# Patient Record
Sex: Male | Born: 1985
Health system: Southern US, Community
[De-identification: ages and names within clinical notes are randomized; demographics above are authoritative.]

## PROBLEM LIST (undated history)

## (undated) HISTORY — PX: APPENDECTOMY: SHX54

## (undated) HISTORY — PX: KNEE ARTHROSCOPY WITH MENISCAL REPAIR: SHX5653

---
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
## 2003-12-19 ENCOUNTER — Emergency Department (HOSPITAL_COMMUNITY): Admission: EM | Admit: 2003-12-19 | Discharge: 2003-12-20 | Payer: Self-pay | Admitting: Emergency Medicine

## 2009-07-29 ENCOUNTER — Ambulatory Visit (HOSPITAL_COMMUNITY): Admission: RE | Admit: 2009-07-29 | Discharge: 2009-07-29 | Payer: Self-pay | Admitting: Neurology

## 2010-12-18 ENCOUNTER — Encounter: Payer: Self-pay | Admitting: Physician Assistant

## 2011-01-01 NOTE — Op Note (Signed)
[facility]  Patient:    Robert Valentine, Robert Valentine                      MRN: [id number] Proc. Date: 12/21/00 Adm. Date:  [id number] Disc. Date: [id number] Attending:  Meredith Leeds                           Operative Report  PREOPERATIVE DIAGNOSIS:  Acute appendicitis.  POSTOPERATIVE DIAGNOSIS:  Acute appendicitis.  OPERATION PERFORMED:  Laparoscopic appendectomy.  SURGEON:  Dr. Orson Slick.  ANESTHESIA:  General.  DESCRIPTION OF PROCEDURE:  After the patient was anesthetized and had routine preparation and draping of the abdomen and a Foley catheter put in, I made a short incision transversely just below the umbilicus, cut the fascia longitudinally and entered the peritoneum bluntly. I put in an #0 Vicryl pursestring suture and secured a Hasson cannula. After inflating the abdomen with CO2, I inspected the viscera and saw no abnormalities except for an inflamed appendix. I put in two additional ports and then exposed the area and retracted the appendix upward. The patient was thin the anatomy was quite clear. I dissected adhesions away from the cecum and pelvic wall and then stapled the appendix and its mesentery with a single firing of the endoscopic vascular height stapler. Hemostasis was good. There was no free fluid noted. I removed the appendix from the body in a plastic pouch through the lower abdominal incision and then checked and saw that there was no bleeding from that incision. I removed the right lateral port under direct vision and then released the pneumoperitoneum and tied the pursestring suture in the umbilical incision. I closed the skin over all the incisions with intracuticular 4-0 Vicryl and Steri-Strips and applied bandages. He tolerated the operation well. D:  12/21/00 TD:  12/22/00 Job: [id number]

## 2013-01-11 ENCOUNTER — Encounter: Payer: BC Managed Care – PPO | Admitting: Nurse Practitioner

## 2013-01-11 NOTE — Progress Notes (Signed)
This encounter was created in error - please disregard.

## 2013-01-18 ENCOUNTER — Ambulatory Visit: Payer: BC Managed Care – PPO | Admitting: Nurse Practitioner

## 2015-04-09 ENCOUNTER — Telehealth: Payer: Self-pay | Admitting: Family Medicine

## 2015-04-16 NOTE — Telephone Encounter (Signed)
Phone call for acute care appt was note returned.  Encounter closed.

## 2015-11-19 ENCOUNTER — Ambulatory Visit (INDEPENDENT_AMBULATORY_CARE_PROVIDER_SITE_OTHER): Payer: Self-pay | Admitting: Family Medicine

## 2015-11-19 ENCOUNTER — Encounter (INDEPENDENT_AMBULATORY_CARE_PROVIDER_SITE_OTHER): Payer: Self-pay

## 2015-11-19 ENCOUNTER — Encounter: Payer: Self-pay | Admitting: Family Medicine

## 2015-11-19 VITALS — BP 114/66 | HR 68 | Temp 98.0°F | Ht 67.0 in | Wt 190.0 lb

## 2015-11-19 DIAGNOSIS — Z024 Encounter for examination for driving license: Secondary | ICD-10-CM

## 2015-11-19 DIAGNOSIS — Z139 Encounter for screening, unspecified: Secondary | ICD-10-CM

## 2015-11-19 DIAGNOSIS — Z029 Encounter for administrative examinations, unspecified: Secondary | ICD-10-CM

## 2015-11-19 LAB — URINALYSIS
Bilirubin, UA: NEGATIVE
GLUCOSE, UA: NEGATIVE
KETONES UA: NEGATIVE
LEUKOCYTES UA: NEGATIVE
Nitrite, UA: NEGATIVE
RBC, UA: NEGATIVE
Specific Gravity, UA: 1.02 (ref 1.005–1.030)
UUROB: 0.2 mg/dL (ref 0.2–1.0)
pH, UA: 6 (ref 5.0–7.5)

## 2015-11-19 NOTE — Progress Notes (Signed)
Subjective:  Patient ID: Robert Valentine, male    DOB: 1986/05/31  Age: 30 y.o. MRN: [id number]  CC: DOT PE   HPI Robert Valentine presents for DOT PE   History Robert Valentine has no past medical history on file.   He has no past surgical history on file.   His family history is not on file.He reports that he has never smoked. He does not have any smokeless tobacco history on file. He reports that he drinks about 6.0 oz of alcohol per week. He reports that he does not use illicit drugs.    ROS Review of Systems  Constitutional: Negative for fever, chills, diaphoresis, activity change, appetite change, fatigue and unexpected weight change.  HENT: Negative for congestion, ear pain, hearing loss, postnasal drip, rhinorrhea, sore throat, tinnitus and trouble swallowing.   Eyes: Negative for photophobia, pain, discharge and redness.  Respiratory: Negative for apnea, cough, choking, chest tightness, shortness of breath, wheezing and stridor.   Cardiovascular: Negative for chest pain, palpitations and leg swelling.  Gastrointestinal: Negative for nausea, vomiting, abdominal pain, diarrhea, constipation, blood in stool and abdominal distention.  Endocrine: Negative for cold intolerance, heat intolerance, polydipsia, polyphagia and polyuria.  Genitourinary: Negative for dysuria, urgency, frequency, hematuria, flank pain, enuresis, difficulty urinating and genital sores.  Musculoskeletal: Negative for joint swelling and arthralgias.  Skin: Negative for color change, rash and wound.  Allergic/Immunologic: Negative for immunocompromised state.  Neurological: Negative for dizziness, tremors, seizures, syncope, facial asymmetry, speech difficulty, weakness, light-headedness, numbness and headaches.  Hematological: Does not bruise/bleed easily.  Psychiatric/Behavioral: Negative for suicidal ideas, hallucinations, behavioral problems, confusion, sleep disturbance, dysphoric mood, decreased concentration  and agitation. The patient is not nervous/anxious and is not hyperactive.     Objective:  BP 114/66 mmHg  Pulse 68  Temp(Src) 98 F (36.7 C) (Oral)  Ht  (1.702 m)  Wt 190 lb (86.183 kg)  BMI 29.75 kg/m2  SpO2 100%  BP Readings from Last 3 Encounters:  11/19/15 114/66    Wt Readings from Last 3 Encounters:  11/19/15 190 lb (86.183 kg)     Physical Exam  Constitutional: He is oriented to person, place, and time. He appears well-developed and well-nourished.  HENT:  Head: Normocephalic and atraumatic.  Mouth/Throat: Oropharynx is clear and moist.  Eyes: EOM are normal. Pupils are equal, round, and reactive to light.  Neck: Normal range of motion. No tracheal deviation present. No thyromegaly present.  Cardiovascular: Normal rate, regular rhythm and normal heart sounds.  Exam reveals no gallop and no friction rub.   No murmur heard. Pulmonary/Chest: Breath sounds normal. He has no wheezes. He has no rales.  Abdominal: Soft. He exhibits no mass. There is no tenderness.  Musculoskeletal: Normal range of motion. He exhibits no edema.  Neurological: He is alert and oriented to person, place, and time.  Skin: Skin is warm and dry.  Psychiatric: He has a normal mood and affect.     No results found for: WBC, HGB, HCT, PLT, GLUCOSE, CHOL, TRIG, HDL, LDLDIRECT, LDLCALC, ALT, AST, NA, K, CL, CREATININE, BUN, CO2, TSH, PSA, INR, GLUF, HGBA1C, MICROALBUR  Mr Brain Wo Contrast  07/29/2009  Clinical Data: Headache.  Numbness.  MRI HEAD WITHOUT CONTRAST  Technique:  Multiplanar, multiecho pulse sequences of the brain and surrounding structures were obtained according to standard protocol without intravenous contrast.  Comparison: None  Findings: The brain has a normal appearance on all pulse sequences without evidence of atrophy, old or acute infarction,  mass lesion, hemorrhage, hydrocephalus or extra-axial collection.  The pituitary gland appears normal.  There are mild mucosal  inflammatory changes of the right maxillary sinus and of a central division of the frontal sinus.  No skull or skull base lesion is seen.  IMPRESSION: Normal appearance of the brain.  Mild sinus mucosal inflammation affecting the right maxillary sinus and a central division of the frontal sinus. Provider: Rodney BoozeMichael Gilliam, Gareth MorganSusan Singleton   Assessment & Plan:   [name] was seen today for dot pe.  Diagnoses and all orders for this visit:  Screening -     Urinalysis  Encounter for Department of Transportation (DOT) examination for driving license renewal    Counseled pt. Regarding recreational drug use and various health as well as professional risks       Follow-up: No Follow-up on file.  Mechele ClaudeWarren Iriel Nason, M.D.

## 2020-04-24 DIAGNOSIS — M791 Myalgia, unspecified site: Secondary | ICD-10-CM | POA: Diagnosis not present

## 2020-04-24 DIAGNOSIS — R519 Headache, unspecified: Secondary | ICD-10-CM | POA: Diagnosis not present

## 2020-04-24 DIAGNOSIS — Z1152 Encounter for screening for COVID-19: Secondary | ICD-10-CM | POA: Diagnosis not present

## 2021-04-28 ENCOUNTER — Encounter: Payer: Self-pay | Admitting: Family Medicine

## 2021-04-28 ENCOUNTER — Other Ambulatory Visit: Payer: Self-pay

## 2021-04-28 ENCOUNTER — Ambulatory Visit (INDEPENDENT_AMBULATORY_CARE_PROVIDER_SITE_OTHER): Payer: BC Managed Care – PPO

## 2021-04-28 ENCOUNTER — Ambulatory Visit (INDEPENDENT_AMBULATORY_CARE_PROVIDER_SITE_OTHER): Payer: BC Managed Care – PPO | Admitting: Family Medicine

## 2021-04-28 VITALS — BP 132/82 | HR 71 | Temp 97.5°F | Ht 67.0 in | Wt 189.0 lb

## 2021-04-28 DIAGNOSIS — Z125 Encounter for screening for malignant neoplasm of prostate: Secondary | ICD-10-CM

## 2021-04-28 DIAGNOSIS — R030 Elevated blood-pressure reading, without diagnosis of hypertension: Secondary | ICD-10-CM

## 2021-04-28 DIAGNOSIS — R202 Paresthesia of skin: Secondary | ICD-10-CM | POA: Diagnosis not present

## 2021-04-28 DIAGNOSIS — M5410 Radiculopathy, site unspecified: Secondary | ICD-10-CM

## 2021-04-28 DIAGNOSIS — G8929 Other chronic pain: Secondary | ICD-10-CM

## 2021-04-28 DIAGNOSIS — Z6829 Body mass index (BMI) 29.0-29.9, adult: Secondary | ICD-10-CM | POA: Diagnosis not present

## 2021-04-28 DIAGNOSIS — M25511 Pain in right shoulder: Secondary | ICD-10-CM

## 2021-04-28 DIAGNOSIS — Z8042 Family history of malignant neoplasm of prostate: Secondary | ICD-10-CM | POA: Diagnosis not present

## 2021-04-28 MED ORDER — METHYLPREDNISOLONE ACETATE 80 MG/ML IJ SUSP
80.0000 mg | Freq: Once | INTRAMUSCULAR | Status: AC
  Administered 2021-04-28: 80 mg via INTRAMUSCULAR

## 2021-04-28 NOTE — Progress Notes (Signed)
Subjective:  Patient ID: Robert Valentine, male    DOB: July 13, 1986, 35 y.o.   MRN: [id number]  Patient Care Team: Baruch Gouty, FNP as PCP - General (Family Medicine)   Chief Complaint:  New Patient (Initial Visit) (Establish new patient//Right shoulder pain, numbness down to the hand//Patient hoping for MRI)   HPI: Robert Valentine is a 35 y.o. male presenting on 04/28/2021 for New Patient (Initial Visit) (Establish new patient//Right shoulder pain, numbness down to the hand//Patient hoping for MRI)   Pt presents today to establish care with PCP and for evaluation of right shoulder pain with numbness at times in his right thumb and index finger. He has not been seen by a PCP in several years and has not had any recent lab work. 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 States his shoulder has been hurting for several years but has become more frequent and bothersome over the last few months. He will take an aleve if the pain is severe with some relief of symptoms. No loss of function but does have significant pain with certain movements. Denies neck pain. No known injuries.  He states his father had prostate cancer and was told it was genetic, he was supposed to start PSA screenings at 5 but did not. He denies any urinary symptoms or rectal pressure or pain.    Relevant past medical, surgical, family, and social history reviewed and updated as indicated.  Allergies and medications reviewed and updated. Data reviewed: Chart in Epic.   History reviewed. No pertinent past medical history.  History reviewed. No pertinent surgical history.  Social History   Socioeconomic History   Marital status: Married    Spouse name: Not on file   Number of children: Not on file   Years of education: Not on file   Highest education level: Not on file  Occupational History   Not on file  Tobacco Use   Smoking status: Never    Smokeless tobacco: Current    Types: Snuff  Substance and Sexual Activity   Alcohol use: Yes    Alcohol/week: 10.0 standard drinks    Types: 10 Standard drinks or equivalent per week   Drug use: No   Sexual activity: Not on file  Other Topics Concern   Not on file  Social History Narrative   Not on file   Social Determinants of Health   Financial Resource Strain: Not on file  Food Insecurity: Not on file  Transportation Needs: Not on file  Physical Activity: Not on file  Stress: Not on file  Social Connections: Not on file  Intimate Partner Violence: Not on file    Outpatient Encounter Medications as of 04/28/2021  Medication Sig   [EXPIRED] methylPREDNISolone acetate (DEPO-MEDROL) injection 80 mg    No facility-administered encounter medications on file as of 04/28/2021.    No Known Allergies  Review of Systems  Constitutional:  Negative for activity change, appetite change, chills, diaphoresis, fatigue, fever and unexpected weight change.  HENT: Negative.    Eyes: Negative.  Negative for photophobia and visual disturbance.  Respiratory:  Negative for cough, chest tightness and shortness of breath.   Cardiovascular:  Negative for chest pain, palpitations and leg swelling.  Gastrointestinal:  Negative for abdominal distention, abdominal pain, blood in stool, constipation, diarrhea, nausea, rectal pain and vomiting.  Endocrine: Negative.  Negative for cold intolerance, heat intolerance, polydipsia, polyphagia and polyuria.  Genitourinary:  Negative for decreased urine volume, difficulty urinating, dysuria, enuresis, flank pain, frequency, genital sores, hematuria, penile discharge, penile pain, penile swelling, scrotal swelling, testicular pain and urgency.  Musculoskeletal:  Positive for arthralgias. Negative for back pain, gait problem, joint swelling, myalgias, neck pain and neck stiffness.  Skin: Negative.   Allergic/Immunologic: Negative.   Neurological:  Positive for  numbness (at times to right thumb and index finger). Negative for dizziness, tremors, seizures, syncope, facial asymmetry, speech difficulty, weakness, light-headedness and headaches.  Hematological: Negative.   Psychiatric/Behavioral:  Negative for confusion, hallucinations, sleep disturbance and suicidal ideas.   All other systems reviewed and are negative.      Objective:  BP 132/82   Pulse 71   Temp (!) 97.5 F (36.4 C)   Ht $R'5\' 7"'gm$  (1.702 m)   Wt 189 lb (85.7 kg)   SpO2 98%   BMI 29.60 kg/m    Wt Readings from Last 3 Encounters:  04/28/21 189 lb (85.7 kg)  11/19/15 190 lb (86.2 kg)    Physical Exam Vitals and nursing note reviewed.  Constitutional:      General: He is not in acute distress.    Appearance: Normal appearance. He is well-developed, well-groomed and overweight. He is not ill-appearing, toxic-appearing or diaphoretic.  HENT:     Head: Normocephalic and atraumatic.     Jaw: There is normal jaw occlusion.     Right Ear: Hearing, tympanic membrane, ear canal and external ear normal.     Left Ear: Hearing, tympanic membrane, ear canal and external ear normal.     Nose: Nose normal.     Mouth/Throat:     Lips: Pink.     Mouth: Mucous membranes are moist.     Pharynx: Oropharynx is clear. Uvula midline.  Eyes:     General: Lids are normal.     Extraocular Movements: Extraocular movements intact.     Conjunctiva/sclera: Conjunctivae normal.     Pupils: Pupils are equal, round, and reactive to light.  Neck:     Thyroid: No thyroid mass, thyromegaly or thyroid tenderness.     Vascular: No carotid bruit or JVD.     Trachea: Trachea and phonation normal.  Cardiovascular:     Rate and Rhythm: Normal rate and regular rhythm.     Chest Wall: PMI is not displaced.     Pulses: Normal pulses.     Heart sounds: Normal heart sounds. No murmur heard.   No friction rub. No gallop.  Pulmonary:     Effort: Pulmonary effort is normal. No respiratory distress.     Breath  sounds: Normal breath sounds. No wheezing.  Abdominal:     General: Bowel sounds are normal. There is no distension or abdominal bruit.     Palpations: Abdomen is soft. There is no hepatomegaly or splenomegaly.     Tenderness: There is no abdominal tenderness. There is no right CVA tenderness or left CVA tenderness.     Hernia: No hernia is present.  Musculoskeletal:     Right shoulder: Tenderness present. No swelling, deformity, effusion, laceration, bony tenderness or crepitus. Decreased range of motion (pain with forward flexion and adduction). Normal strength. Normal pulse.     Left shoulder: Normal.     Right upper arm: Normal.     Cervical back: Normal, normal range of motion and neck supple.     Right lower leg: No edema.     Left lower leg: No edema.  Lymphadenopathy:     Cervical:  No cervical adenopathy.  Skin:    General: Skin is warm and dry.     Capillary Refill: Capillary refill takes less than 2 seconds.     Coloration: Skin is not cyanotic, jaundiced or pale.     Findings: No rash.  Neurological:     General: No focal deficit present.     Mental Status: He is alert and oriented to person, place, and time.     Cranial Nerves: Cranial nerves are intact. No cranial nerve deficit.     Sensory: Sensation is intact. No sensory deficit.     Motor: Motor function is intact. No weakness.     Coordination: Coordination is intact. Coordination normal.     Gait: Gait is intact. Gait normal.     Deep Tendon Reflexes: Reflexes are normal and symmetric. Reflexes normal.  Psychiatric:        Attention and Perception: Attention and perception normal.        Mood and Affect: Mood and affect normal.        Speech: Speech normal.        Behavior: Behavior normal. Behavior is cooperative.        Thought Content: Thought content normal.        Cognition and Memory: Cognition and memory normal.        Judgment: Judgment normal.    Results for orders placed or performed in visit on  11/19/15  Urinalysis  Result Value Ref Range   Specific Gravity, UA 1.020 1.005 - 1.030   pH, UA 6.0 5.0 - 7.5   Color, UA Yellow Yellow   Appearance Ur Clear Clear   Leukocytes, UA Negative Negative   Protein, UA Trace Negative/Trace   Glucose, UA Negative Negative   Ketones, UA Negative Negative   RBC, UA Negative Negative   Bilirubin, UA Negative Negative   Urobilinogen, Ur 0.2 0.2 - 1.0 mg/dL   Nitrite, UA Negative Negative     X-Ray: C-Spine: Narrowing of disc space at C6. No acute findings. Preliminary x-ray reading by Monia Pouch, FNP-C, [facility]. X-Ray: right shoulder: No acute findings, possible bone spur / arthritis changes. Preliminary x-ray reading by Monia Pouch, FNP-C, [facility].   Pertinent labs & imaging results that were available during my care of the patient were reviewed by me and considered in my medical decision making.  Assessment & Plan:  Viral was seen today for new patient (initial visit).  Diagnoses and all orders for this visit:  Chronic right shoulder pain Imaging without acute findings but concerning for arthritic changes / bone spur. Will burst with steroids toady and refer to PT. Will notify pt if radiology reading differs. Symptomatic care discussed in detail. Report any new, worsening, or persistent symptoms.  -     DG Cervical Spine Complete -     DG Shoulder Right  Radiculopathy of arm Imaging without acute findings, disc space narrowing noted at C6. No loss of function or decreased grip strength. Will notify pt if radiology reading differs. Depo-medrol given in office today and referral to PT placed. Pt aware to report any new, worsening, or persistent symptoms.  -     DG Cervical Spine Complete -     DG Shoulder Right -     methylPREDNISolone acetate (DEPO-MEDROL) injection 80 mg  BMI 29.0-29.9,adult Diet and exercise encouraged. Will obtain below labs.  -     CBC with Differential/Platelet -     CMP14+EGFR -     Lipid panel -  Thyroid  Panel With TSH  Elevated blood-pressure reading without diagnosis of hypertension DASH diet and exercise encouraged. Will check below labs for possible underlying causes. Follow up in 6-8 weeks for BP recheck.  -     CBC with Differential/Platelet -     CMP14+EGFR -     Lipid panel -     Thyroid Panel With TSH  Screening for prostate cancer Family history of prostate cancer in father Genetic per pt, was supposed to start screening at age 34, has not had PSA to date, will obtain today.  -     PSA, total and free     Continue all other maintenance medications.  Follow up plan: Return in about 6 weeks (around 06/09/2021), or if symptoms worsen or fail to improve.   Continue healthy lifestyle choices, including diet (rich in fruits, vegetables, and lean proteins, and low in salt and simple carbohydrates) and exercise (at least 30 minutes of moderate physical activity daily).  Educational handout given for cervical radiculopathy  The above assessment and management plan was discussed with the patient. The patient verbalized understanding of and has agreed to the management plan. Patient is aware to call the clinic if they develop any new symptoms or if symptoms persist or worsen. Patient is aware when to return to the clinic for a follow-up visit. Patient educated on when it is appropriate to go to the emergency department.   Monia Pouch, FNP-C [facility] [phone]

## 2021-04-29 LAB — CMP14+EGFR
ALT: 30 IU/L (ref 0–44)
AST: 26 IU/L (ref 0–40)
Albumin/Globulin Ratio: 2.5 — ABNORMAL HIGH (ref 1.2–2.2)
Albumin: 4.9 g/dL (ref 4.0–5.0)
Alkaline Phosphatase: 60 IU/L (ref 44–121)
BUN/Creatinine Ratio: 16 (ref 9–20)
BUN: 15 mg/dL (ref 6–20)
Bilirubin Total: 0.7 mg/dL (ref 0.0–1.2)
CO2: 24 mmol/L (ref 20–29)
Calcium: 9.6 mg/dL (ref 8.7–10.2)
Chloride: 101 mmol/L (ref 96–106)
Creatinine, Ser: 0.93 mg/dL (ref 0.76–1.27)
Globulin, Total: 2 g/dL (ref 1.5–4.5)
Glucose: 78 mg/dL (ref 65–99)
Potassium: 4.5 mmol/L (ref 3.5–5.2)
Sodium: 139 mmol/L (ref 134–144)
Total Protein: 6.9 g/dL (ref 6.0–8.5)
eGFR: 109 mL/min/{1.73_m2} (ref >59)

## 2021-04-29 LAB — CBC WITH DIFFERENTIAL/PLATELET
Basophils Absolute: 0.1 10*3/uL (ref 0.0–0.2)
Basos: 1 %
EOS (ABSOLUTE): 0.2 10*3/uL (ref 0.0–0.4)
Eos: 3 %
Hematocrit: 48.6 % (ref 37.5–51.0)
Hemoglobin: 16.3 g/dL (ref 13.0–17.7)
Immature Grans (Abs): 0 10*3/uL (ref 0.0–0.1)
Immature Granulocytes: 0 %
Lymphocytes Absolute: 3.3 10*3/uL — ABNORMAL HIGH (ref 0.7–3.1)
Lymphs: 37 %
MCH: 29.3 pg (ref 26.6–33.0)
MCHC: 33.5 g/dL (ref 31.5–35.7)
MCV: 87 fL (ref 79–97)
Monocytes Absolute: 0.6 10*3/uL (ref 0.1–0.9)
Monocytes: 7 %
Neutrophils Absolute: 4.7 10*3/uL (ref 1.4–7.0)
Neutrophils: 52 %
Platelets: 219 10*3/uL (ref 150–450)
RBC: 5.57 x10E6/uL (ref 4.14–5.80)
RDW: 13.1 % (ref 11.6–15.4)
WBC: 8.8 10*3/uL (ref 3.4–10.8)

## 2021-04-29 LAB — LIPID PANEL
Chol/HDL Ratio: 3.7 ratio (ref 0.0–5.0)
Cholesterol, Total: 201 mg/dL — ABNORMAL HIGH (ref 100–199)
HDL: 54 mg/dL (ref >39)
LDL Chol Calc (NIH): 130 mg/dL — ABNORMAL HIGH (ref 0–99)
Triglycerides: 94 mg/dL (ref 0–149)
VLDL Cholesterol Cal: 17 mg/dL (ref 5–40)

## 2021-04-29 LAB — THYROID PANEL WITH TSH
Free Thyroxine Index: 2.6 (ref 1.2–4.9)
T3 Uptake Ratio: 31 % (ref 24–39)
T4, Total: 8.5 ug/dL (ref 4.5–12.0)
TSH: 0.649 u[IU]/mL (ref 0.450–4.500)

## 2021-04-29 LAB — PSA, TOTAL AND FREE
PSA, Free Pct: 51.3 %
PSA, Free: 0.41 ng/mL
Prostate Specific Ag, Serum: 0.8 ng/mL (ref 0.0–4.0)

## 2021-05-07 ENCOUNTER — Other Ambulatory Visit: Payer: Self-pay

## 2021-05-07 ENCOUNTER — Ambulatory Visit: Payer: BC Managed Care – PPO | Attending: Family Medicine | Admitting: Physical Therapy

## 2021-05-07 ENCOUNTER — Encounter: Payer: Self-pay | Admitting: Physical Therapy

## 2021-05-07 DIAGNOSIS — R293 Abnormal posture: Secondary | ICD-10-CM | POA: Diagnosis not present

## 2021-05-07 DIAGNOSIS — M542 Cervicalgia: Secondary | ICD-10-CM | POA: Diagnosis not present

## 2021-05-07 NOTE — Therapy (Signed)
[facility] [address] [place], [place], [address] Phone: [phone]   Fax:  [phone]  Physical Therapy Evaluation  Patient Details  Name: Robert Valentine MRN: [id number] Date of Birth: [birth year] Referring Provider (PT): Gilford Silvius   Encounter Date: 05/07/2021   PT End of Session - 05/07/21 0845     Visit Number 1    Number of Visits 8    Date for PT Re-Evaluation 06/11/21    PT Start Time 0814    PT Stop Time 0841    PT Time Calculation (min) 27 min    Activity Tolerance Patient tolerated treatment well    Behavior During Therapy [facility] for tasks assessed/performed             History reviewed. No pertinent past medical history.  History reviewed. No pertinent surgical history.  There were no vitals filed for this visit.    Subjective Assessment - 05/07/21 0849     Subjective COVID-19 screen performed prior to patient entering clinic.  The patient presents to the clinic today with c/o right sided neck and shoulder pain that has been getting worse over the last two years.  He works in [employer], corporate and states that the pain can become severe and the 3rd, 4th and 5th fingers of his righthand will go numb.  he drops objects as well.  He states neck and right shoulder pain wakes him at night.  He states he has a good pillow for sleeping.  He has not found anything makes him feel better.  It discorages him as he says he can only trhow a baseball with his son for about 20 minutes until he has to stop due to pain and symptoms.    Pertinent History Unremarkable.    How long can you sit comfortably? Varies.  Pain can come on suddenly while sitting though.    Diagnostic tests X-rays to right shoulder and neck.    Currently in Pain? Yes    Pain Score 6     Pain Location Neck   Right shoulder.   Pain Descriptors / Indicators Throbbing;Tightness    Pain Type Chronic pain    Pain Radiating Towards Right hand  (3rd to 5th fingers).    Pain Onset More than a month ago    Pain Frequency Constant    Aggravating Factors  See above.    Pain Relieving Factors "Not sure."                [facility] PT Assessment - 05/07/21 0001       Assessment   Medical Diagnosis Radiculopathy of right arm, chronic right shoulder pain.    Referring Provider (PT) Gilford Silvius    Onset Date/Surgical Date --   ~2 years.   Hand Dominance Right      Precautions   Precautions None      Restrictions   Weight Bearing Restrictions No      Balance Screen   Has the patient fallen in the past 6 months No    Has the patient had a decrease in activity level because of a fear of falling?  No    Is the patient reluctant to leave their home because of a fear of falling?  No      Home Environment   Living Environment Private residence      Posture/Postural Control   Posture/Postural Control Postural limitations    Postural Limitations Rounded Shoulders;Forward head  Deep Tendon Reflexes   DTR Assessment Site Biceps;Brachioradialis;Triceps    Biceps DTR 1+    Brachioradialis DTR 1+    Triceps DTR 1+      ROM / Strength   AROM / PROM / Strength AROM;Strength      AROM   Overall AROM Comments Full active right shoulder range of motion.  Right active cervical rotation is 62 degrees and left is 80 degrees.  Right SBing is 22 degrees adn left is 30 degrees.      Strength   Overall Strength Comments Normal right UE strength.  Right grip (dominant side) is 100# and left is 95#.      Palpation   Palpation comment Tender to palpation right of C6-7 and UT.      Special Tests   Other special tests (-) Decreased pain with cervical distraction test.  Pain reproduction with right shoulder Impingement testing.                        Objective measurements completed on examination: See above findings.                     PT Long Term Goals - 05/07/21 0947       PT LONG TERM GOAL #1    Title Independent with a HEP.    Time 4    Period Weeks    Status New      PT LONG TERM GOAL #2   Title Increase right active cervical rotation to 80 degrees+ so patient can turn head more easily while driving.    Time 6    Period Weeks    Status New      PT LONG TERM GOAL #3   Title Eliminate right UE symptoms.    Time 4    Period Weeks    Status New      PT LONG TERM GOAL #4   Title Perform work duties with pain not > 3/10.    Time 4    Period Weeks    Status New      PT LONG TERM GOAL #5   Title Sleep undisturbed 6 hours.    Time 4    Period Weeks    Status New                    Plan - 05/07/21 0941     Clinical Impression Statement The patient presents to [facility]/o right sided neck and shoulder pain and numbness over the 3rd to 5th finger of his right hand.  This problem has been worsening over the last two years.  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  Symptoms also wake him at night.  He has some limitations of active cervical range of motion.  His right UE strength is normal.  Bilateral UE DTR's are 1+/4+.  His pain decreases with a cervical distraction test but he had some pain reproduction with right shoulder impingement testing.  This has affected his quality of life as he can not throw a baseball with his son more than 20 minutes before having to stop due to intense pain and symptoms.  Patient will benefit from skilled physical therapy intervention to address pain and deficits.    Examination-Activity Limitations Sleep;Carry    Examination-Participation Restrictions Other   Work activities.   Stability/Clinical Decision Making Evolving/Moderate complexity    Clinical Decision Making Low  Rehab Potential Good    PT Frequency 2x / week    PT Duration 4 weeks    PT Treatment/Interventions ADLs/Self Care Home Management;Cryotherapy;Electrical Stimulation;Ultrasound;Traction;Moist Heat;Therapeutic activities;Therapeutic  exercise;Manual techniques;Patient/family education;Passive range of motion;Dry needling    PT Next Visit Plan Right shoulder RW4, postural exercises, intermittemnt cervical traction beginning at 15#, active cervical range of motion, chin tucks and cervical extension.  Modalities and STW/M as needed.    Consulted and Agree with Plan of Care Patient             Patient will benefit from skilled therapeutic intervention in order to improve the following deficits and impairments:  Pain, Increased muscle spasms, Decreased activity tolerance, Postural dysfunction, Decreased range of motion  Visit Diagnosis: Cervicalgia - Plan: PT plan of care cert/re-cert  Abnormal posture - Plan: PT plan of care cert/re-cert     Problem List Patient Active Problem List   Diagnosis Date Noted   BMI 29.0-29.9,adult 04/28/2021   Elevated blood-pressure reading without diagnosis of hypertension 04/28/2021    Trason Shifflet, [place], PT 05/07/2021, 9:53 AM  [facility] [address] [place], [place], [address] Phone: [phone]   Fax:  [phone]  Name: Robert Valentine MRN: [id number] Date of Birth: 1986-04-01

## 2021-05-15 ENCOUNTER — Ambulatory Visit: Payer: BC Managed Care – PPO | Admitting: Physical Therapy

## 2021-05-15 ENCOUNTER — Encounter: Payer: Self-pay | Admitting: Physical Therapy

## 2021-05-15 ENCOUNTER — Other Ambulatory Visit: Payer: Self-pay

## 2021-05-15 DIAGNOSIS — M542 Cervicalgia: Secondary | ICD-10-CM

## 2021-05-15 DIAGNOSIS — R293 Abnormal posture: Secondary | ICD-10-CM

## 2021-05-15 NOTE — Therapy (Signed)
[facility] [address] [place], [place], [address] Phone: [phone]   Fax:  [phone]  Physical Therapy Treatment  Patient Details  Name: Robert Valentine MRN: [id number] Date of Birth: 30-Jul-1986 Referring Provider (PT): Gilford Silvius   Encounter Date: 05/15/2021   PT End of Session - 05/15/21 0734     Visit Number 2    Number of Visits 8    Date for PT Re-Evaluation 06/11/21    PT Start Time 0734    PT Stop Time 0816    PT Time Calculation (min) 42 min    Activity Tolerance Patient tolerated treatment well    Behavior During Therapy [facility] for tasks assessed/performed             History reviewed. No pertinent past medical history.  History reviewed. No pertinent surgical history.  There were no vitals filed for this visit.   Subjective Assessment - 05/15/21 0732     Subjective Continued pain and tightness in cervical musculature. Has been waking due to pain being more exaggerated than normal. Pain still going down RUE.    Pertinent History Unremarkable.    How long can you sit comfortably? Varies.  Pain can come on suddenly while sitting though.    Diagnostic tests X-rays to right shoulder and neck.    Currently in Pain? Yes    Pain Score 3     Pain Location Neck    Pain Orientation Right    Pain Descriptors / Indicators Tightness;Discomfort    Pain Type Chronic pain    Pain Radiating Towards R hand (1st, 4-5th finger)    Pain Onset More than a month ago    Pain Frequency Constant                [facility] PT Assessment - 05/15/21 0001       Assessment   Medical Diagnosis Radiculopathy of right arm, chronic right shoulder pain.    Referring Provider (PT) Gilford Silvius    Hand Dominance Right    Next MD Visit 05/2021      Precautions   Precautions None      Restrictions   Weight Bearing Restrictions No                           [facility] Adult PT Treatment/Exercise - 05/15/21 0001        Exercises   Exercises Neck;Shoulder      Neck Exercises: Machines for Strengthening   UBE (Upper Arm Bike) 120 RPM x4 min (forward/backward)   stopped due to pain, beginning numbness     Neck Exercises: Theraband   Shoulder Extension 20 reps;Limitations    Shoulder Extension Limitations Blue XTS    Rows 20 reps;Limitations    Rows Limitations Blue XTS    Shoulder External Rotation 15 reps;Red    Horizontal ABduction 15 reps;Red      Neck Exercises: Standing   Wall Push Ups 20 reps      Neck Exercises: Supine   Neck Retraction 15 reps;5 secs      Neck Exercises: Stretches   Other Neck Stretches Vertical and horizontal bolster for thoracic stretch x1 min each      Manual Therapy   Manual Therapy Soft tissue mobilization    Soft tissue mobilization STW to B cervical paraspinals, scalenes, suboccipital release to reduce tightness and mobility limitations  PT Long Term Goals - 05/07/21 0947       PT LONG TERM GOAL #1   Title Independent with a HEP.    Time 4    Period Weeks    Status New      PT LONG TERM GOAL #2   Title Increase right active cervical rotation to 80 degrees+ so patient can turn head more easily while driving.    Time 6    Period Weeks    Status New      PT LONG TERM GOAL #3   Title Eliminate right UE symptoms.    Time 4    Period Weeks    Status New      PT LONG TERM GOAL #4   Title Perform work duties with pain not > 3/10.    Time 4    Period Weeks    Status New      PT LONG TERM GOAL #5   Title Sleep undisturbed 6 hours.    Time 4    Period Weeks    Status New                   Plan - 05/15/21 0855     Clinical Impression Statement Patient presented in clinic with reports of continued cervical pain and RUE pain. Patient guided through light postural and cervical strengthening exercises with intermittant reports of pain that limited therex session. 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 Patient limited with R SL for sleeping position as well due to pain. Patient able to tolerate STW and manual traction well and able to report more relief and improved R cervical rotation.    Examination-Activity Limitations Sleep;Carry    Examination-Participation Restrictions Other    Stability/Clinical Decision Making Evolving/Moderate complexity    Rehab Potential Good    PT Frequency 2x / week    PT Duration 4 weeks    PT Treatment/Interventions ADLs/Self Care Home Management;Cryotherapy;Electrical Stimulation;Ultrasound;Traction;Moist Heat;Therapeutic activities;Therapeutic exercise;Manual techniques;Patient/family education;Passive range of motion;Dry needling    PT Next Visit Plan Right shoulder RW4, postural exercises, intermittemnt cervical traction beginning at 15#, active cervical range of motion, chin tucks and cervical extension.  Modalities and STW/M as needed.    Consulted and Agree with Plan of Care Patient             Patient will benefit from skilled therapeutic intervention in order to improve the following deficits and impairments:  Pain, Increased muscle spasms, Decreased activity tolerance, Postural dysfunction, Decreased range of motion  Visit Diagnosis: Cervicalgia  Abnormal posture     Problem List Patient Active Problem List   Diagnosis Date Noted   BMI 29.0-29.9,adult 04/28/2021   Elevated blood-pressure reading without diagnosis of hypertension 04/28/2021    Marvell Fuller, PTA 05/15/2021, 10:13 AM  [facility] [address] [place], [place], [address] Phone: [phone]   Fax:  [phone]  Name: Robert Valentine MRN: [id number] Date of Birth: March 04, 1986

## 2021-05-22 ENCOUNTER — Other Ambulatory Visit: Payer: Self-pay

## 2021-05-22 ENCOUNTER — Ambulatory Visit: Payer: BC Managed Care – PPO | Attending: Family Medicine | Admitting: Physical Therapy

## 2021-05-22 ENCOUNTER — Encounter: Payer: Self-pay | Admitting: Physical Therapy

## 2021-05-22 DIAGNOSIS — R293 Abnormal posture: Secondary | ICD-10-CM | POA: Insufficient documentation

## 2021-05-22 DIAGNOSIS — M542 Cervicalgia: Secondary | ICD-10-CM | POA: Diagnosis not present

## 2021-05-22 NOTE — Therapy (Signed)
[facility] [address] [place], [place], [address] Phone: [phone]   Fax:  [phone]  Physical Therapy Treatment  Patient Details  Name: Robert Valentine MRN: [id number] Date of Birth: [birth year] Referring Provider (PT): Gilford Silvius   Encounter Date: 05/22/2021   PT End of Session - 05/22/21 0738     Visit Number 3    Number of Visits 8    Date for PT Re-Evaluation 06/11/21    PT Start Time 0737    PT Stop Time 0816    PT Time Calculation (min) 39 min    Activity Tolerance Patient tolerated treatment well    Behavior During Therapy [facility] for tasks assessed/performed             History reviewed. No pertinent past medical history.  History reviewed. No pertinent surgical history.  There were no vitals filed for this visit.   Subjective Assessment - 05/22/21 0736     Subjective No "sleep" sensation in RUE but still has some pain. Same pain in cervical spine at base of skull. Has had high level pain all week and almost called out Wednesday due to pain.    Pertinent History Unremarkable.    How long can you sit comfortably? Varies.  Pain can come on suddenly while sitting though.    Diagnostic tests X-rays to right shoulder and neck.    Currently in Pain? Yes    Pain Score 3     Pain Location Neck    Pain Orientation Right    Pain Descriptors / Indicators Discomfort    Pain Type Chronic pain    Pain Onset More than a month ago    Pain Frequency Constant                [facility] PT Assessment - 05/22/21 0001       Assessment   Medical Diagnosis Radiculopathy of right arm, chronic right shoulder pain.    Referring Provider (PT) Gilford Silvius    Hand Dominance Right    Next MD Visit 05/2021      Precautions   Precautions None                           [facility] Adult PT Treatment/Exercise - 05/22/21 0001       Neck Exercises: Supine   Neck Retraction 15 reps;5 secs    Neck Retraction  Limitations retraction with cervical ext x10 reps    Shoulder Flexion Both;15 reps    Shoulder Flexion Limitations with cervical retraciton    Shoulder ABduction Both;15 reps;Limitations    Shoulder Abduction Limitations with cervical retraction; red theraband    Upper Extremity D2 Flexion;15 reps;Limitations    UE D2 Limitations with cervical retraction      Manual Therapy   Manual Therapy Soft tissue mobilization;Manual Traction    Soft tissue mobilization STW to B cervical paraspinals, scalenes, suboccipital release to reduce tightness and mobility limitations    Manual Traction Manual cervical traction x5 reps 10-15 sec holds                          PT Long Term Goals - 05/07/21 0947       PT LONG TERM GOAL #1   Title Independent with a HEP.    Time 4    Period Weeks    Status New      PT LONG TERM GOAL #  2   Title Increase right active cervical rotation to 80 degrees+ so patient can turn head more easily while driving.    Time 6    Period Weeks    Status New      PT LONG TERM GOAL #3   Title Eliminate right UE symptoms.    Time 4    Period Weeks    Status New      PT LONG TERM GOAL #4   Title Perform work duties with pain not > 3/10.    Time 4    Period Weeks    Status New      PT LONG TERM GOAL #5   Title Sleep undisturbed 6 hours.    Time 4    Period Weeks    Status New                   Plan - 05/22/21 0819     Clinical Impression Statement Patient limited overall upon arrival by pain as he had relief for a few days after last treatment but pain returned after Monday. Patient was instructed through light cervical training of cervical retraction. Patient experienced an episode of spasm with cervical retraction and extension. Therex closely monitored to avoid any exaggerated pain or new symptoms. Increased tone palpable of suboccipitals and cervical paraspinals. Patient reported relief following manual STW and manual traction.     Examination-Activity Limitations Sleep;Carry    Examination-Participation Restrictions Other    Stability/Clinical Decision Making Evolving/Moderate complexity    Rehab Potential Good    PT Frequency 2x / week    PT Duration 4 weeks    PT Treatment/Interventions ADLs/Self Care Home Management;Cryotherapy;Electrical Stimulation;Ultrasound;Traction;Moist Heat;Therapeutic activities;Therapeutic exercise;Manual techniques;Patient/family education;Passive range of motion;Dry needling    PT Next Visit Plan Right shoulder RW4, postural exercises, intermittemnt cervical traction beginning at 15#, active cervical range of motion, chin tucks and cervical extension.  Modalities and STW/M as needed.    Consulted and Agree with Plan of Care Patient             Patient will benefit from skilled therapeutic intervention in order to improve the following deficits and impairments:  Pain, Increased muscle spasms, Decreased activity tolerance, Postural dysfunction, Decreased range of motion  Visit Diagnosis: Cervicalgia  Abnormal posture     Problem List Patient Active Problem List   Diagnosis Date Noted   BMI 29.0-29.9,adult 04/28/2021   Elevated blood-pressure reading without diagnosis of hypertension 04/28/2021    Marvell Fuller, PTA 05/22/2021, 8:23 AM  [facility] [address] [place], [place], [address] Phone: [phone]   Fax:  [phone]  Name: Robert Valentine MRN: [id number] Date of Birth: 10-22-1985

## 2021-05-29 ENCOUNTER — Ambulatory Visit: Payer: BC Managed Care – PPO

## 2021-05-29 ENCOUNTER — Other Ambulatory Visit: Payer: Self-pay

## 2021-05-29 DIAGNOSIS — R293 Abnormal posture: Secondary | ICD-10-CM

## 2021-05-29 DIAGNOSIS — M542 Cervicalgia: Secondary | ICD-10-CM | POA: Diagnosis not present

## 2021-05-29 NOTE — Therapy (Addendum)
[facility] [place], [place], [address] Phone: [phone]   Fax:  [phone]  Physical Therapy Treatment  Patient Details  Name: Robert Valentine MRN: [id number] Date of Birth: 1986/05/26 Referring Provider (PT): Darla Lesches   Encounter Date: 05/29/2021   PT End of Session - 05/29/21 0748     Visit Number 4    Number of Visits 8    Date for PT Re-Evaluation 06/11/21    PT Start Time 0730    PT Stop Time 0814    PT Time Calculation (min) 44 min    Activity Tolerance Patient tolerated treatment well;Patient limited by pain    Behavior During Therapy [facility] for tasks assessed/performed             History reviewed. No pertinent past medical history.  History reviewed. No pertinent surgical history.  There were no vitals filed for this visit.   Subjective Assessment - 05/29/21 0731     Subjective Patient reports that his arm has not gone to sleep in over two weeks so he feels like the therapy is helping. However, his shoulder still hurts.    Pertinent History Unremarkable.    How long can you sit comfortably? Varies.  Pain can come on suddenly while sitting though.    Diagnostic tests X-rays to right shoulder and neck.    Currently in Pain? Yes    Pain Location Shoulder    Pain Orientation Right    Pain Descriptors / Indicators Throbbing;Sore    Pain Type Chronic pain    Pain Onset More than a month ago    Pain Frequency Constant                               [facility] Adult PT Treatment/Exercise - 05/29/21 0001       Neck Exercises: Theraband   Shoulder Extension 20 reps;Limitations    Shoulder Extension Limitations Blue XTS, pain    Shoulder ADduction 20 reps    Shoulder ADduction Limitations Blue XTS    Shoulder Internal Rotation 5 reps;Red   Painful     Neck Exercises: Standing   Neck Retraction 20 reps   with ball behind neck     Neck Exercises: Seated   Other Seated Exercise  Self Cervical Distraction      Shoulder Exercises: ROM/Strengthening   Ball on Wall for shoulder flexion   2 minutes     Manual Therapy   Manual Therapy Soft tissue mobilization;Manual Traction    Soft tissue mobilization STW to B cervical paraspinals, scalenes, suboccipital release to reduce tightness and mobility limitations    Manual Traction Manual cervical traction with prolonged hold                          PT Long Term Goals - 05/07/21 0947       PT LONG TERM GOAL #1   Title Independent with a HEP.    Time 4    Period Weeks    Status New      PT LONG TERM GOAL #2   Title Increase right active cervical rotation to 80 degrees+ so patient can turn head more easily while driving.    Time 6    Period Weeks    Status New      PT LONG TERM GOAL #3   Title Eliminate right UE symptoms.  Time 4    Period Weeks    Status New      PT LONG TERM GOAL #4   Title Perform work duties with pain not > 3/10.    Time 4    Period Weeks    Status New      PT LONG TERM GOAL #5   Title Sleep undisturbed 6 hours.    Time 4    Period Weeks    Status New                   Plan - 05/29/21 0820     Clinical Impression Statement Patient was attempted to be introduced to resisted internal rotation. However, he was unable to perform this activity due to a significant increase in his right shoulder pain. Treatment then focused on manual therapy with cervical distraction being the most effective. He was then introduced to a self cervical distraction with a towel and this was added to his home exercise program. He reported that his neck felt a lot better, but his shoulder was still hurting upon the conclusion of treatment. He would benefit from continued physical therapy to address his remaining impairments to return to his prior level of function.    Examination-Activity Limitations Sleep;Carry    Examination-Participation Restrictions Other    Stability/Clinical  Decision Making Evolving/Moderate complexity    Rehab Potential Good    PT Frequency 2x / week    PT Duration 4 weeks    PT Treatment/Interventions ADLs/Self Care Home Management;Cryotherapy;Electrical Stimulation;Ultrasound;Traction;Moist Heat;Therapeutic activities;Therapeutic exercise;Manual techniques;Patient/family education;Passive range of motion;Dry needling    PT Next Visit Plan Right shoulder RW4, postural exercises, intermittemnt cervical traction beginning at 15#, active cervical range of motion, chin tucks and cervical extension.  Modalities and STW/M as needed.    PT Home Exercise Plan Self cervical distraction with towel    Consulted and Agree with Plan of Care Patient             Patient will benefit from skilled therapeutic intervention in order to improve the following deficits and impairments:  Pain, Increased muscle spasms, Decreased activity tolerance, Postural dysfunction, Decreased range of motion  Visit Diagnosis: Cervicalgia  Abnormal posture     Problem List Patient Active Problem List   Diagnosis Date Noted   BMI 29.0-29.9,adult 04/28/2021   Elevated blood-pressure reading without diagnosis of hypertension 04/28/2021    Darlin Coco, PT 05/29/2021, 9:20 AM  [facility] [address] [place], [place], [address] Phone: [phone]   Fax:  [phone]  Name: Robert Valentine MRN: [id number] Date of Birth: 04-29-1986  PHYSICAL THERAPY DISCHARGE SUMMARY  Visits from Start of Care: 4  Current functional level related to goals / functional outcomes: Patient was unable to meet his goals for therapy.    Remaining deficits: Pain    Education / Equipment: HEP   Patient agrees to discharge. Patient goals were not met. Patient is being discharged due to not returning since the last visit.  Jacqulynn Cadet, PT, DPT

## 2021-06-04 ENCOUNTER — Ambulatory Visit: Payer: BC Managed Care – PPO

## 2021-06-09 ENCOUNTER — Ambulatory Visit: Payer: BC Managed Care – PPO | Admitting: Family Medicine

## 2021-06-10 ENCOUNTER — Encounter: Payer: Self-pay | Admitting: Family Medicine

## 2021-09-25 ENCOUNTER — Telehealth: Payer: Self-pay | Admitting: Family Medicine

## 2021-09-29 ENCOUNTER — Other Ambulatory Visit: Payer: Self-pay | Admitting: Family Medicine

## 2021-09-29 DIAGNOSIS — Z302 Encounter for sterilization: Secondary | ICD-10-CM

## 2021-09-29 NOTE — Telephone Encounter (Signed)
Referral placed.

## 2021-10-02 ENCOUNTER — Ambulatory Visit: Payer: BC Managed Care – PPO | Admitting: Family Medicine

## 2021-10-03 IMAGING — DX DG CERVICAL SPINE COMPLETE 4+V
5 series · 5 of 5 positions shown · non-contrast
Comparison: None.

CLINICAL DATA: Numbness and tingling in right arm

EXAM:
CERVICAL SPINE - COMPLETE 4+ VIEW

[c-spine lat]
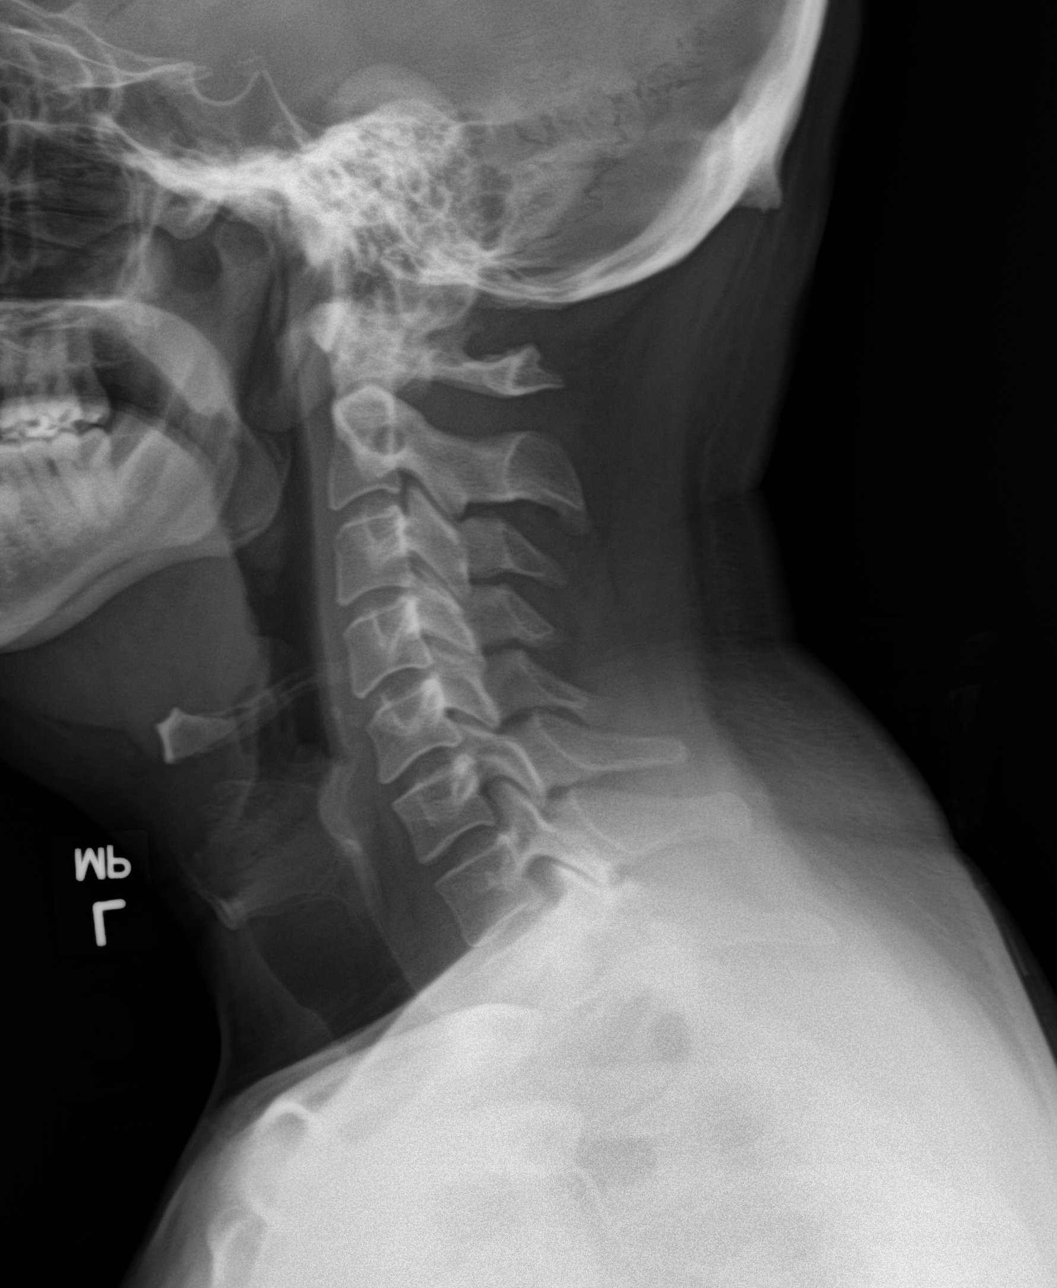

[c-spine obl (1 of 2)]
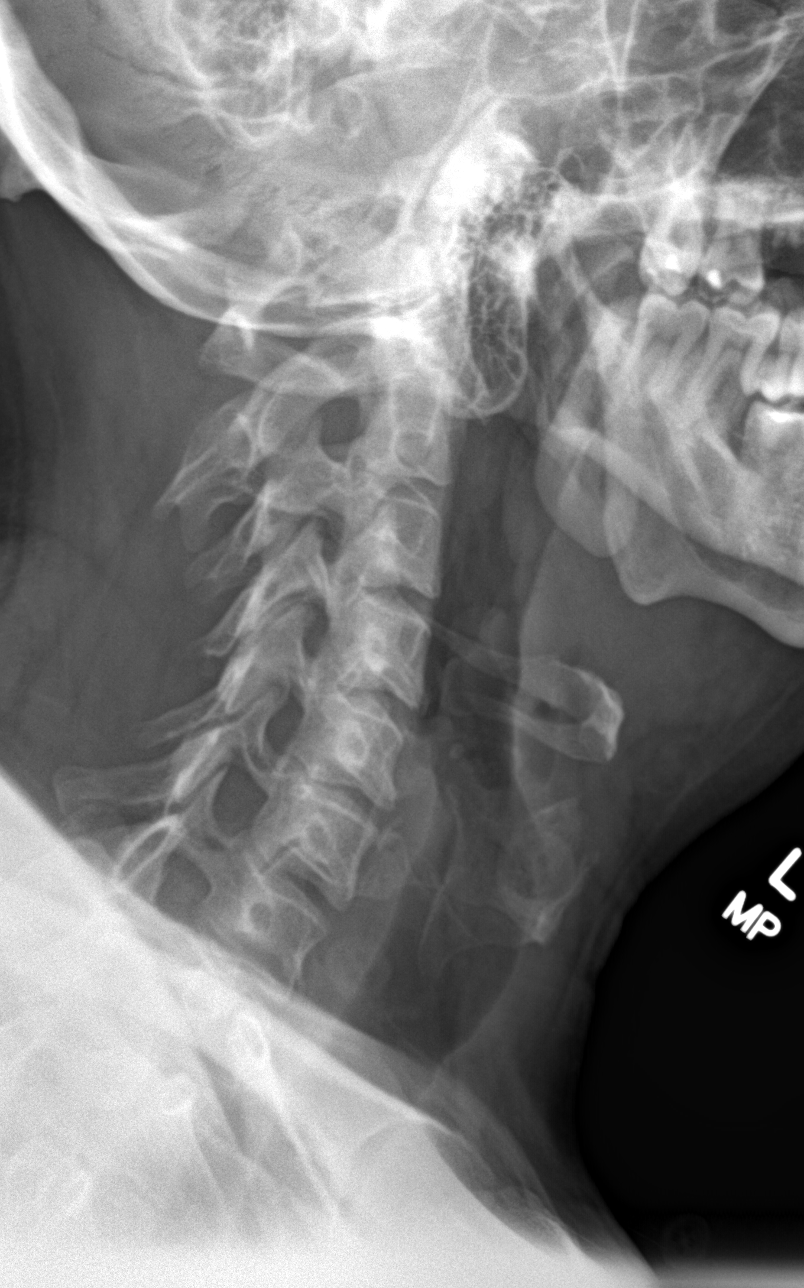

[c-spine obl (2 of 2)]
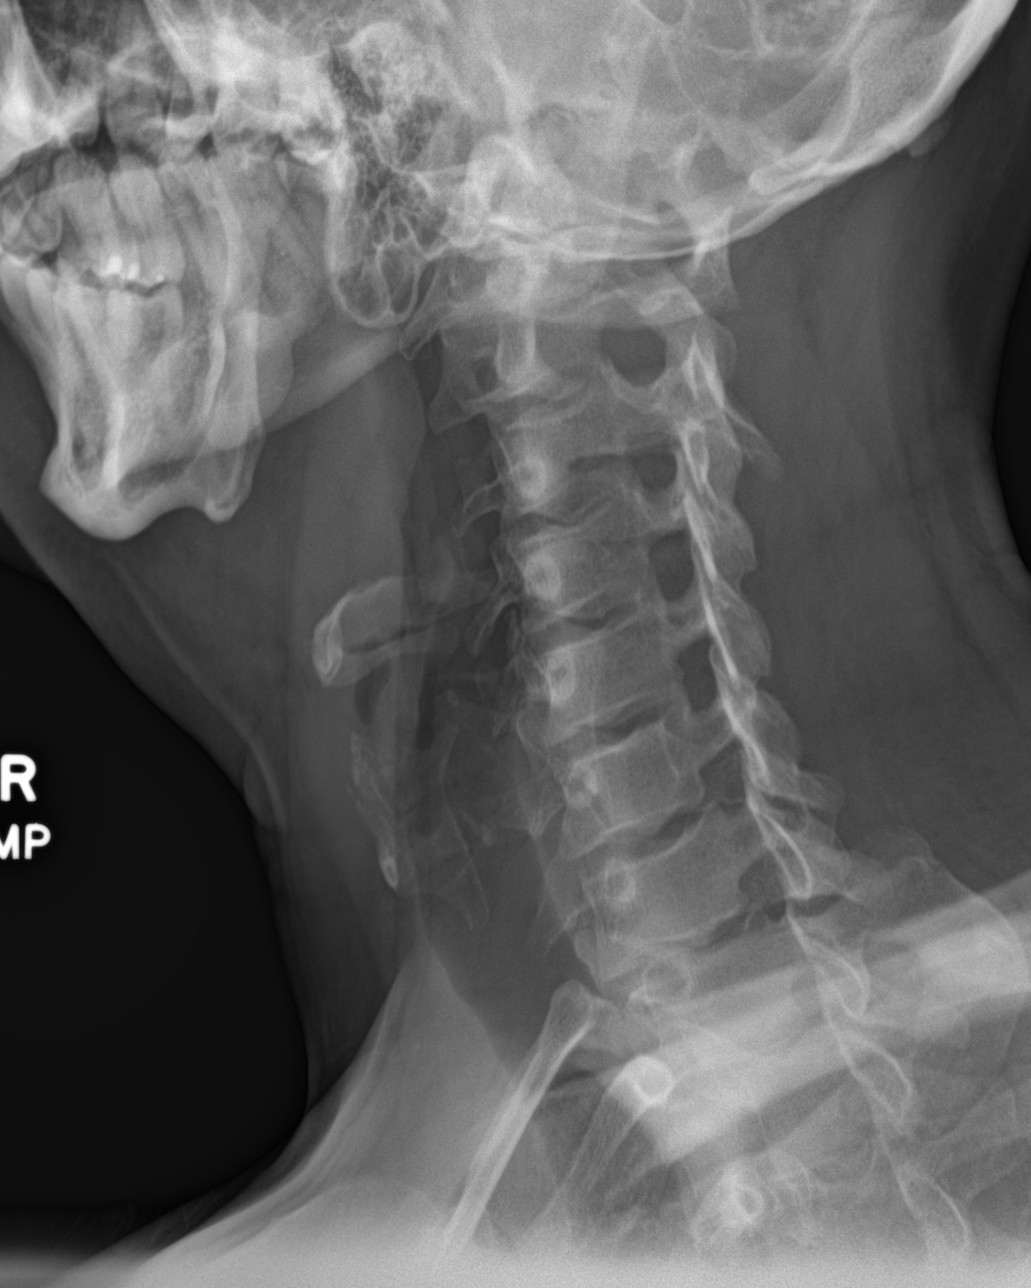

[c-spine ap]
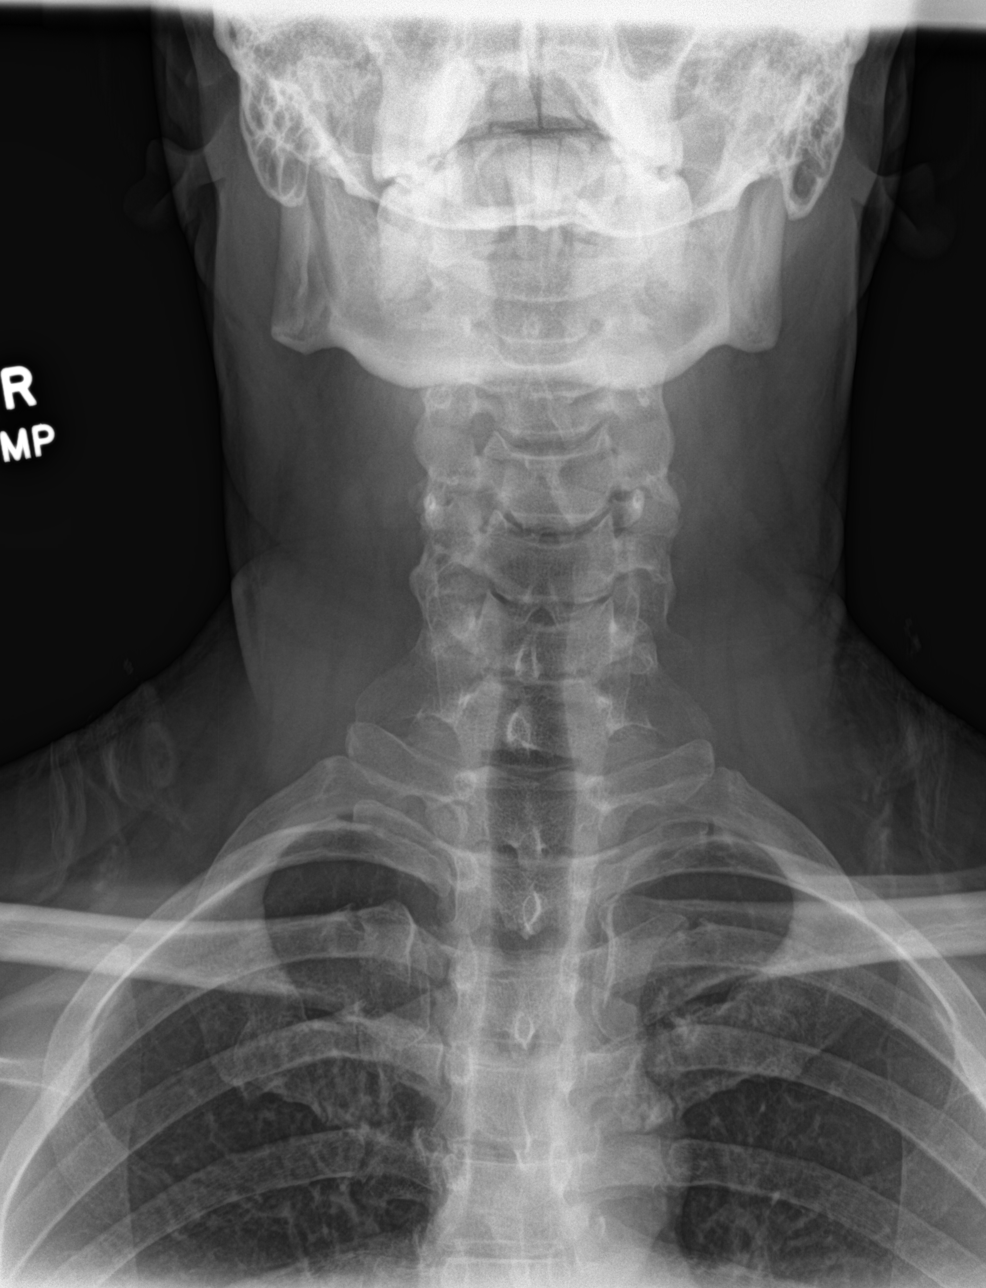

[c-spine open mouth]
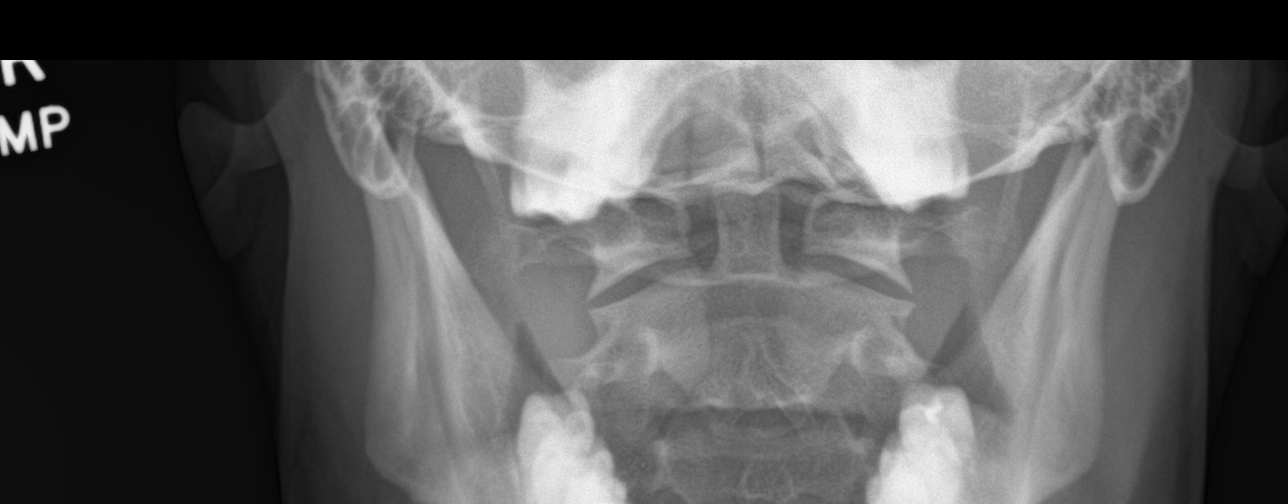

[5 of 5 positions shown; findings below may reference images not displayed]

FINDINGS: Two small cervical ribs are identified. No fracture or traumatic
malalignment. No significant degenerative change. Lateral masses of
C1 align with C2. The odontoid process is normal. The lung apices
are normal.
IMPRESSION: Two small cervical ribs are identified.  No other abnormalities.

## 2021-10-22 DIAGNOSIS — Z3009 Encounter for other general counseling and advice on contraception: Secondary | ICD-10-CM | POA: Diagnosis not present

## 2021-10-27 ENCOUNTER — Encounter: Payer: Self-pay | Admitting: Nurse Practitioner

## 2021-10-27 ENCOUNTER — Ambulatory Visit (INDEPENDENT_AMBULATORY_CARE_PROVIDER_SITE_OTHER): Payer: BC Managed Care – PPO | Admitting: Nurse Practitioner

## 2021-10-27 ENCOUNTER — Ambulatory Visit (INDEPENDENT_AMBULATORY_CARE_PROVIDER_SITE_OTHER): Payer: BC Managed Care – PPO

## 2021-10-27 VITALS — BP 123/75 | HR 78 | Temp 98.6°F | Resp 20 | Ht 67.0 in | Wt 200.0 lb

## 2021-10-27 DIAGNOSIS — M5441 Lumbago with sciatica, right side: Secondary | ICD-10-CM

## 2021-10-27 DIAGNOSIS — M545 Low back pain, unspecified: Secondary | ICD-10-CM | POA: Diagnosis not present

## 2021-10-27 MED ORDER — KETOROLAC TROMETHAMINE 60 MG/2ML IM SOLN
60.0000 mg | Freq: Once | INTRAMUSCULAR | Status: AC
  Administered 2021-10-27: 60 mg via INTRAMUSCULAR

## 2021-10-27 MED ORDER — PREDNISONE 20 MG PO TABS: ORAL_TABLET | ORAL | 0 refills | Status: DC

## 2021-10-27 MED ORDER — METHYLPREDNISOLONE ACETATE 80 MG/ML IJ SUSP
80.0000 mg | Freq: Once | INTRAMUSCULAR | Status: AC
  Administered 2021-10-27: 80 mg via INTRAMUSCULAR

## 2021-10-27 NOTE — Progress Notes (Signed)
? ?  Subjective:  ? ? Patient ID: Robert Valentine, male    DOB: November 23, 1985, 36 y.o.   MRN: [id number] ? ? ?Chief Complaint: Back and leg pain ? ? ?HPI ?Patient fell off of a 6 foot ladder about 2 hours ago. He says actually the ladder fell with him on it and he landed on the ladder on hi lower back. He can hardly stand or  sit due to the pain. Rates 10/10 currently. Pain radiates all the way down his right leg. ? ? ? ?Review of Systems  ?Constitutional:  Negative for diaphoresis.  ?Eyes:  Negative for pain.  ?Respiratory:  Negative for shortness of breath.   ?Cardiovascular:  Negative for chest pain, palpitations and leg swelling.  ?Gastrointestinal:  Negative for abdominal pain.  ?Endocrine: Negative for polydipsia.  ?Musculoskeletal:  Positive for back pain.  ?Skin:  Negative for rash.  ?Neurological:  Negative for dizziness, weakness and headaches.  ?Hematological:  Does not bruise/bleed easily.  ?All other systems reviewed and are negative. ? ?   ?Objective:  ? Physical Exam ?Vitals reviewed.  ?Constitutional:   ?   Appearance: Normal appearance.  ?Cardiovascular:  ?   Rate and Rhythm: Normal rate and regular rhythm.  ?   Heart sounds: Normal heart sounds.  ?Pulmonary:  ?   Effort: Pulmonary effort is normal.  ?   Breath sounds: Normal breath sounds.  ?Musculoskeletal:  ?   Comments: Lumbar pain on sitting and standing ?(-) SLR bil  ?Neurological:  ?   Mental Status: He is alert.  ? ?BP 123/75   Pulse 78   Temp 98.6 ?F (37 ?C) (Oral)   Resp 20   Ht 5\' 7"  (1.702 m)   Wt 200 lb (90.7 kg)   BMI 31.32 kg/m?  ? ? ? ? ?   ?Assessment & Plan:  ? ?Robert Valentine in today with chief complaint of Back and leg pain ? ? ?1. Acute midline low back pain with right-sided sciatica ?Ice today  ?Rest ?No bending or stooping ?- DG Lumbar Spine 2-3 Views ? ?Meds ordered this encounter  ?Medications  ? methylPREDNISolone acetate (DEPO-MEDROL) injection 80 mg  ? ketorolac (TORADOL) injection 60 mg  ? predniSONE (DELTASONE) 20 MG  tablet  ?  Sig: 2 po at sametime daily for 5 days-  ?  Dispense:  10 tablet  ?  Refill:  0  ?  Order Specific Question:   Supervising Provider  ?  Answer:   Ancil Boozer A [[id number]]  ? ? ? ?The above assessment and management plan was discussed with the patient. The patient verbalized understanding of and has agreed to the management plan. Patient is aware to call the clinic if symptoms persist or worsen. Patient is aware when to return to the clinic for a follow-up visit. Patient educated on when it is appropriate to go to the emergency department.  ? ?Mary-Margaret Arville Care, FNP ? ? ?

## 2021-10-27 NOTE — Patient Instructions (Signed)

## 2021-12-11 DIAGNOSIS — Z302 Encounter for sterilization: Secondary | ICD-10-CM | POA: Diagnosis not present

## 2022-09-17 ENCOUNTER — Ambulatory Visit: Payer: BC Managed Care – PPO | Admitting: Family Medicine

## 2022-09-17 ENCOUNTER — Encounter: Payer: Self-pay | Admitting: Nurse Practitioner

## 2022-09-17 ENCOUNTER — Ambulatory Visit: Payer: BC Managed Care – PPO | Admitting: Nurse Practitioner

## 2022-09-17 VITALS — BP 118/76 | HR 48 | Temp 97.9°F | Resp 20 | Ht 67.0 in | Wt 194.0 lb

## 2022-09-17 DIAGNOSIS — M7711 Lateral epicondylitis, right elbow: Secondary | ICD-10-CM | POA: Diagnosis not present

## 2022-09-17 DIAGNOSIS — M7712 Lateral epicondylitis, left elbow: Secondary | ICD-10-CM

## 2022-09-17 DIAGNOSIS — Z800 Family history of malignant neoplasm of digestive organs: Secondary | ICD-10-CM

## 2022-09-17 MED ORDER — PREDNISONE 10 MG (21) PO TBPK: ORAL_TABLET | ORAL | 0 refills | Status: DC

## 2022-09-17 NOTE — Progress Notes (Signed)
   Subjective:    Patient ID: Robert Valentine, male    DOB: [birth year], 37 y.o.   MRN: [id number]   Chief Complaint: Severe pain in both elbows   HPI Patient comes in today c/o bil elbow pain. Started 2 years ago and has just gradually been worsening. Pain is so bad that he is dropping things. His dad had same thing years ago and ended up having to have nerve transfer bil. He has not taken anything for it.     Review of Systems  Constitutional:  Negative for diaphoresis.  Eyes:  Negative for pain.  Respiratory:  Negative for shortness of breath.   Cardiovascular:  Negative for chest pain, palpitations and leg swelling.  Gastrointestinal:  Negative for abdominal pain.  Endocrine: Negative for polydipsia.  Skin:  Negative for rash.  Neurological:  Negative for dizziness, weakness and headaches.  Hematological:  Does not bruise/bleed easily.  All other systems reviewed and are negative.      Objective:   Physical Exam Constitutional:      Appearance: Normal appearance.  Musculoskeletal:     Comments: Point tenderness bil lateral epicondyle  Skin:    General: Skin is warm.  Neurological:     General: No focal deficit present.     Mental Status: He is alert and oriented to person, place, and time.  Psychiatric:        Mood and Affect: Mood normal.        Behavior: Behavior normal.    BP 118/76   Pulse (!) 48   Temp 97.9 F (36.6 C) (Temporal)   Resp 20   Ht 5\' 7"  (1.702 m)   Wt 194 lb (88 kg)   SpO2 96%   BMI 30.38 kg/m         Assessment & Plan:   Robert Valentine in today with chief complaint of Severe pain in both elbows   1. Family hx of colon cancer - Ambulatory referral to Gastroenterology  2. Bilateral tennis elbow Wear elbow straps Meds ordered this encounter  Medications   predniSONE (STERAPRED UNI-PAK 21 TAB) 10 MG (21) TBPK tablet    Sig: As directed x 6 days    Dispense:  21 tablet    Refill:  0    Order Specific Question:   Supervising  Provider    Answer:   Caryl Pina A [[id number]]   If not improving will do ortho referral    The above assessment and management plan was discussed with the patient. The patient verbalized understanding of and has agreed to the management plan. Patient is aware to call the clinic if symptoms persist or worsen. Patient is aware when to return to the clinic for a follow-up visit. Patient educated on when it is appropriate to go to the emergency department.   Mary-Margaret Hassell Done, FNP

## 2022-09-17 NOTE — Patient Instructions (Signed)
Tennis Elbow  Tennis elbow (lateral epicondylitis) is inflammation of tendons in your outer forearm, near your elbow. Tendons are tissues that connect muscle to bone. When you have tennis elbow, inflammation affects the tendons that you use to bend your wrist and move your hand up. Inflammation occurs in the lower part of the upper arm bone (humerus), where the tendons connect to the bone (lateral epicondyle). Tennis elbow often affects people who play tennis, but anyone may get the condition from repeatedly extending the wrist or turning the forearm. What are the causes? This condition is usually caused by repeatedly extending the wrist, turning the forearm, and using the hands. It can result from sports or work that requires repetitive forearm movements. In some cases, it may be caused by a sudden injury. What increases the risk? You are more likely to develop tennis elbow if you play tennis or another racket sport. You also have a higher risk if you frequently use your hands for work. Besides people who play tennis, others at greater risk include: People who use computers. Construction workers. People who work in factories. Musicians. Cooks. Cashiers. What are the signs or symptoms? Symptoms of this condition include: Pain and tenderness in the forearm and the outer part of the elbow. Pain may be felt only when using the arm, or it may be there all the time. A burning feeling that starts in the elbow and spreads down the forearm. A weak grip in the hand. How is this diagnosed? This condition is diagnosed based on your symptoms, your medical history, and a physical exam. You may also have X-rays or an MRI to: Confirm the diagnosis. Look for other issues. Check for tears in the ligaments, muscles, or tendons. How is this treated? Resting and icing your arm is often the first treatment. Your health care provider may also recommend: Medicines to reduce pain and inflammation. These may be in  the form of a pill, topical gels, or shots of a steroid medicine (cortisone). An elbow strap to reduce stress on the area. Physical therapy. This may include massage or exercises or both. An elbow brace to restrict the movements that cause symptoms. If these treatments do not help relieve your symptoms, your health care provider may recommend surgery to remove damaged muscle and reattach healthy muscle to bone. Follow these instructions at home: If you have a brace or strap: Wear the brace or strap as told by your health care provider. Remove it only as told by your health care provider. Check the skin around the brace or strap every day. Tell your health care provider about any concerns. Loosen the brace if your fingers tingle, become numb, or turn cold and blue. Keep the brace clean. If the brace or strap is not waterproof: Do not let it get wet. Cover it with a watertight covering when you take a bath or a shower. Managing pain, stiffness, and swelling  If directed, put ice on the injured area. To do this: If you have a removable brace or strap, remove it as told by your health care provider. Put ice in a plastic bag. Place a towel between your skin and the bag. Leave the ice on for 20 minutes, 2-3 times a day. Remove the ice if your skin turns bright red. This is very important. If you cannot feel pain, heat, or cold, you have a greater risk of damage to the area. Move your fingers often to reduce stiffness and swelling. Activity Rest your elbow   and wrist and avoid activities that cause symptoms as told by your health care provider. Do physical therapy exercises as told by your health care provider. If you lift an object, lift it with your palm facing up. This reduces stress on your elbow. Lifestyle If your tennis elbow is caused by sports, check your equipment and make sure that: You use it correctly. It is good match for you. If your tennis elbow is caused by work or computer  use, take frequent breaks to stretch your arm. Talk with your employer about ways to manage your condition at work. General instructions Take over-the-counter and prescription medicines only as told by your health care provider. Do not use any products that contain nicotine or tobacco. These products include cigarettes, chewing tobacco, and vaping devices, such as e-cigarettes. If you need help quitting, ask your health care provider. Keep all follow-up visits. This is important. How is this prevented? Before and after activity: Warm up and stretch before being active. Cool down and stretch after being active. Give your body time to rest between periods of activity. During activity: Make sure to use equipment that fits you. If you play tennis, put power in your stroke with your lower body. Avoid using your arm only. Maintain physical fitness, including: Strength. Flexibility. Endurance. Do exercises to strengthen the forearm muscles. Contact a health care provider if: You have pain that gets worse or does not get better with treatment. You have numbness or weakness in your forearm, hand, or fingers. Get help right away if: Your pain is severe. You cannot move your wrist. Summary Tennis elbow (lateral epicondylitis) is inflammation of tendons in your outer forearm, near your elbow. Common symptoms include pain and tenderness in your forearm and the outer part of your elbow. This condition is usually caused by repeatedly extending your wrist, turning your forearm, and using your hands. The first treatment is often resting and icing your arm to relieve symptoms. Further treatment may include taking medicine, getting physical therapy, wearing a brace or strap, or having surgery. This information is not intended to replace advice given to you by your health care provider. Make sure you discuss any questions you have with your health care provider. Document Revised: 02/12/2020 Document  Reviewed: 02/12/2020 Elsevier Patient Education  2023 [employer]  

## 2022-09-20 ENCOUNTER — Encounter: Payer: Self-pay | Admitting: Gastroenterology

## 2022-10-08 ENCOUNTER — Encounter: Payer: Self-pay | Admitting: Family

## 2022-10-08 ENCOUNTER — Ambulatory Visit: Payer: BC Managed Care – PPO | Admitting: Family

## 2022-10-08 VITALS — BP 116/67 | HR 66 | Temp 97.7°F | Ht 67.0 in | Wt 196.0 lb

## 2022-10-08 DIAGNOSIS — M7711 Lateral epicondylitis, right elbow: Secondary | ICD-10-CM

## 2022-10-08 DIAGNOSIS — M7712 Lateral epicondylitis, left elbow: Secondary | ICD-10-CM

## 2022-10-08 MED ORDER — KETOROLAC TROMETHAMINE 60 MG/2ML IM SOLN
60.0000 mg | Freq: Once | INTRAMUSCULAR | Status: AC
  Administered 2022-10-08: 60 mg via INTRAMUSCULAR

## 2022-10-08 MED ORDER — METHYLPREDNISOLONE ACETATE 80 MG/ML IJ SUSP
80.0000 mg | Freq: Once | INTRAMUSCULAR | Status: AC
  Administered 2022-10-08: 80 mg via INTRAMUSCULAR

## 2022-10-08 MED ORDER — DICLOFENAC SODIUM 75 MG PO TBEC: 75.0000 mg | DELAYED_RELEASE_TABLET | Freq: Two times a day (BID) | ORAL | 0 refills | Status: DC

## 2022-10-08 NOTE — Patient Instructions (Addendum)
Golfer's Elbow  Golfer's elbow (medial epicondylitis) is a condition that results from inflammation of the strong bands of tissue (tendons) that attach your forearm muscles to the inside of your bone at the elbow. These tendons affect the muscles that bend the palm toward the wrist (flexion). The tendons become less flexible with age. This condition is called golfer's elbow because it is more common among people who constantly bend and twist their wrists, such as golfers. This injury is usually caused by repeated use of the same muscles. What are the causes? This condition is caused by: Repeatedly flexing, turning, or twisting your wrist. Frequently gripping objects with your hands. Sudden injury. What increases the risk? This condition is more likely to develop in people who play golf, baseball, or tennis. This injury is more common among people who have jobs that require the constant use of their hands, such as: People who use computers. Carpenters. Butchers. Musicians. What are the signs or symptoms? This condition causes elbow pain that may spread to your forearm and upper arm. Symptoms of this condition include: Pain at the inner elbow, forearm, or wrist. A weak grip in the hand. The pain may get worse when you bend your wrist downward. How is this diagnosed? This condition is diagnosed based on your symptoms, your medical history, and a physical exam. During the exam, your health care provider may: Test your grip strength. Move your wrist to check for pain. You may also have an MRI to: Confirm the diagnosis. Look for other issues. Check for tears in the ligaments, muscles, or tendons. How is this treated? Treatment for this condition includes: Stopping all activities that make you bend or twist your elbow or wrist and waiting until your pain and other symptoms go away before resuming those activities. Wearing an elbow brace or wrist splint to restrict the movements that cause  symptoms. Icing your inner elbow, forearm, or wrist to relieve pain. Taking NSAIDs, such as ibuprofen, or getting corticosteroid injections to reduce pain and swelling. Doing stretching, range-of-motion, and strengthening exercises (physical therapy) as told by your health care provider. In rare cases, surgery may be needed if your condition does not improve. Follow these instructions at home: If you have a brace or splint: Wear the brace or splint as told by your health care provider. Remove it only as told by your health care provider. Check the skin around the brace or splint every day. Tell your health care provider about any concerns. Loosen the brace or splint if your fingers tingle, become numb, or turn cold and blue. Keep it clean. If the brace or splint is not waterproof: Do not let it get wet. Cover it with a watertight covering when you take a bath or shower. Managing pain, stiffness, and swelling  If directed, put ice on the injured area. To do this: If you have a removable brace or splint, remove it as told by your health care provider. Put ice in a plastic bag. Place a towel between your skin and the bag. Leave the ice on for 20 minutes, 2-3 times a day. Remove the ice if your skin turns bright red. This is very important. If you cannot feel pain, heat, or cold, you have a greater risk of damage to the area. Move your fingers often to avoid stiffness and swelling. Activity Rest your injured area as told by your health care provider. Return to your normal activities as told by your health care provider. Ask your health care  provider what activities are safe for you. Do exercises as told by your health care provider. Lifestyle If your condition is caused by sports, work with a trainer to make sure that you: Use the correct technique. Use the proper equipment. If your condition is work related, talk with your employer about ways to manage your condition at work. General  instructions Take over-the-counter and prescription medicines only as told by your health care provider. Do not use any products that contain nicotine or tobacco. These products include cigarettes, chewing tobacco, and vaping devices, such as e-cigarettes. If you need help quitting, ask your health care provider. Keep all follow-up visits. This is important. How is this prevented? Before and after activity: Warm up and stretch before being active. Cool down and stretch after being active. Give your body time to rest between periods of activity. During activity: Make sure to use equipment that fits you. If you play golf, slow your golf swing to reduce shock in the arm when making contact with the ball. Maintain physical fitness, including: Strength. Flexibility. Endurance. Do exercises to strengthen the forearm muscles. Contact a health care provider if: Your pain does not improve or it gets worse. You notice numbness in your hand. Get help right away if: Your pain is severe. You cannot move your wrist. Summary Golfer's elbow, also called medial epicondylitis, is a condition that results from inflammation of the strong bands of tissue (tendons) that attach your forearm muscles to the inside of your bone at the elbow. This injury usually results from overuse. Symptoms of this condition include decreased grip strength and pain at the inner elbow, forearm, or wrist. This injury is treated with rest, a brace or splint, ice, medicines, physical therapy, and surgery as needed.Golfer's Elbow Rehab Ask your health care provider which exercises are safe for you. Do exercises exactly as told by your health care provider and adjust them as directed. It is normal to feel mild stretching, pulling, tightness, or discomfort as you do these exercises. Stop right away if you feel sudden pain or your pain gets worse. Do not begin these exercises until told by your health care provider. Stretching and  range-of-motion exercises These exercises warm up your muscles and joints and improve the movement and flexibility of your elbow. Wrist extension, assisted  Straighten your left / right elbow in front of you with your palm facing up toward the ceiling. If told by your health care provider, bend your left / right elbow to a 90-degree angle (right angle) at your side instead of holding it straight. With your other hand, gently pull your left / right hand and fingers toward the floor (extension). Stop when you feel a gentle stretch on the palm side of your forearm. Hold this position for __________ seconds. Repeat __________ times. Complete this exercise __________ times a day. Wrist flexion, assisted  Straighten your left / right elbow in front of you with your palm facing down toward the floor. If told by your health care provider, bend your left / right elbow to a 90-degree angle (right angle) at your side instead of holding it straight. With your other hand, gently push over the back of your left / right hand so your fingers point toward the floor (flexion). Stop when you feel a gentle stretch on the back of your forearm. Hold this position for __________ seconds. Repeat __________ times. Complete this exercise __________ times a day. Assisted forearm rotation, supination Sit or stand with your elbows at  your side. Bend your left / right elbow to a 90-degree angle (right angle). Using your uninjured hand, turn your left / right palm up toward the ceiling (supination) until you feel a gentle stretch along the inside of your forearm. Hold this position for __________ seconds. Repeat __________ times. Complete this exercise __________ times a day. Assisted forearm rotation, pronation Sit or stand with your elbows at your side. Bend your left / right elbow to a 90-degree angle (right angle). Using your uninjured hand, turn your left / right palm down toward the floor (pronation) until you feel  a gentle stretch along the top of your forearm. Hold this position for __________ seconds. Repeat __________ times. Complete this exercise __________ times a day. Strengthening exercises These exercises build strength and endurance in your elbow. Endurance is the ability to use your muscles for a long time, even after they get tired. Wrist flexion  Sit with your left / right forearm supported on a table or other surface and your palm turned up toward the ceiling. Let your left / right wrist extend over the edge of the surface. Hold a __________ weight or a piece of rubber exercise band or tubing. If using a rubber exercise band or tubing, hold the other end of the tubing with your other hand. Slowly bend your wrist so your hand moves up toward the ceiling (flexion). Try to only move your wrist and keep the rest of your arm still. Hold this position for __________ seconds. Slowly return to the starting position. Repeat __________ times. Complete this exercise __________ times a day. Wrist flexion, eccentric Sit with your left / right forearm palm-up and supported on a table or other surface. Let your left / right wrist extend over the edge of the surface. Hold a __________ weight or a piece of rubber exercise band or tubing in your left / right hand. If using a rubber exercise band or tubing, hold the other end of the tubing with your other hand. Use your uninjured hand to move your left / right hand up toward the ceiling. Take your uninjured hand away and slowly return to the starting position using only your left / right hand (eccentric flexion). Repeat __________ times. Complete this exercise __________ times a day. Forearm rotation, pronation To do this exercise, you will need a lightweight hammer or rubber mallet. Sit with your left / right forearm supported on a table or other surface. Bend your elbow to a 90-degree angle (right angle). Position your forearm so that your palm is facing up  toward the ceiling, with your hand resting over the edge of the table. Hold a hammer in your left / right hand. To make this exercise easier, hold the hammer near the head of the hammer. To make this exercise harder, hold the hammer near the end of the handle. Without moving your elbow, slowly turn (rotate) your forearm so your palm faces down toward the floor (pronation). Hold this position for __________ seconds. Slowly return to the starting position. Repeat __________ times. Complete this exercise __________ times a day. Shoulder blade squeeze Sit in a stable chair or stand with good posture. If you are sitting down, do not let your back touch the back of the chair. Your arms should be at your sides with your elbows bent to a 90-degree angle (right angle). Position your forearms so that your thumbs are facing the ceiling (neutral position). Without lifting your shoulders up, squeeze your shoulder blades tightly together. Hold  this position for __________ seconds. Slowly release and return to the starting position. Repeat __________ times. Complete this exercise __________ times a day. This information is not intended to replace advice given to you by your health care provider. Make sure you discuss any questions you have with your health care provider. Document Revised: 10/24/2019 Document Reviewed: 10/24/2019 Elsevier Patient Education  [employer].  This information is not intended to replace advice given to you by your health care provider. Make sure you discuss any questions you have with your health care provider.   Document Revised: 02/12/2020 Document Reviewed: 02/12/2020 Elsevier Patient Education  [employer].

## 2022-10-08 NOTE — Progress Notes (Signed)
Subjective:    Patient ID: Robert Valentine, male    DOB: [birth year], 37 y.o.   MRN: [id number]  Chief Complaint  Patient presents with   Tendonitis    Both elbows seen mmm for 2 weeks ago.    HPI PT presents to the office today with bilateral elbow pain that started two weeks ago. Robert Valentine was given steroids without relief. 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 Robert Valentine reports aching, shooting pain of 7 out 10. Robert Valentine has taken tylenol and motrin without relief. Robert Valentine has been wearing compression bands around bilateral elbows.    Review of Systems  All other systems reviewed and are negative.      Objective:   Physical Exam Vitals reviewed.  Constitutional:      General: Robert Valentine is not in acute distress.    Appearance: Robert Valentine is well-developed.  HENT:     Head: Normocephalic.     Right Ear: Tympanic membrane normal.     Left Ear: Tympanic membrane normal.  Eyes:     General:        Right eye: No discharge.        Left eye: No discharge.     Pupils: Pupils are equal, round, and reactive to light.  Neck:     Thyroid: No thyromegaly.  Cardiovascular:     Rate and Rhythm: Normal rate and regular rhythm.     Heart sounds: Normal heart sounds. No murmur heard. Pulmonary:     Effort: Pulmonary effort is normal. No respiratory distress.     Breath sounds: Normal breath sounds. No wheezing.  Abdominal:     General: Bowel sounds are normal. There is no distension.     Palpations: Abdomen is soft.     Tenderness: There is no abdominal tenderness.  Musculoskeletal:        General: No tenderness. Normal range of motion.     Cervical back: Normal range of motion and neck supple.     Comments: Full ROM of bilateral elbows and hands  Skin:    General: Skin is warm and dry.     Findings: No erythema or rash.  Neurological:     Mental Status: Robert Valentine is alert and oriented to person, place, and time.     Cranial Nerves: No cranial nerve deficit.     Deep Tendon Reflexes: Reflexes are normal and  symmetric.  Psychiatric:        Behavior: Behavior normal.        Thought Content: Thought content normal.        Judgment: Judgment normal.     BP 116/67   Pulse 66   Temp 97.7 F (36.5 C) (Temporal)   Ht 5' 7"$  (1.702 m)   Wt 196 lb (88.9 kg)   SpO2 99%   BMI 30.70 kg/m        Assessment & Plan:  [name] comes in today with chief complaint of Tendonitis (Both elbows seen mmm for 2 weeks ago.)   Diagnosis and orders addressed:  1. Lateral epicondylitis of both elbows Rest Ice Start diclofenac BID with food, no other NSAID's  ROM exercises encouraged- Handout given Follow up if symptoms worsen or do not improve  - methylPREDNISolone acetate (DEPO-MEDROL) injection 80 mg - ketorolac (TORADOL) injection 60 mg - diclofenac (VOLTAREN) 75 MG EC tablet; Take 1 tablet (75 mg total) by mouth 2 (two) times daily.  Dispense: 30 tablet; Refill: 0 - Ambulatory referral to Physical Therapy  Evelina Dun, FNP

## 2022-10-19 ENCOUNTER — Ambulatory Visit: Payer: BC Managed Care – PPO

## 2022-10-20 ENCOUNTER — Ambulatory Visit: Payer: BC Managed Care – PPO | Admitting: Gastroenterology

## 2022-10-29 ENCOUNTER — Ambulatory Visit: Payer: BC Managed Care – PPO | Admitting: Gastroenterology

## 2022-10-29 ENCOUNTER — Encounter: Payer: Self-pay | Admitting: Gastroenterology

## 2022-10-29 VITALS — BP 122/88 | HR 80 | Ht 67.0 in | Wt 195.0 lb

## 2022-10-29 DIAGNOSIS — Z800 Family history of malignant neoplasm of digestive organs: Secondary | ICD-10-CM | POA: Diagnosis not present

## 2022-10-29 DIAGNOSIS — Z1211 Encounter for screening for malignant neoplasm of colon: Secondary | ICD-10-CM | POA: Diagnosis not present

## 2022-10-29 DIAGNOSIS — G5623 Lesion of ulnar nerve, bilateral upper limbs: Secondary | ICD-10-CM | POA: Diagnosis not present

## 2022-10-29 DIAGNOSIS — M7711 Lateral epicondylitis, right elbow: Secondary | ICD-10-CM | POA: Diagnosis not present

## 2022-10-29 MED ORDER — NA SULFATE-K SULFATE-MG SULF 17.5-3.13-1.6 GM/177ML PO SOLN: 1.0000 | Freq: Once | ORAL | 0 refills | Status: AC

## 2022-10-29 NOTE — Progress Notes (Unsigned)
HPI : Robert Valentine is a very pleasant 37 year old male with no chronic medical history who is referred to [place] by Ronnald Collum, FNP for consideration of early colon cancer screening.  His father was diagnosed with colon cancer around age 75 or 30 and passed away at age 28.  He was apparently told he had a genetic cause of his colon cancer.  He does not what mutation or genetic condition was identified but he says he can find this out (his step mother kept all his medical records). There is no other family history of colon cancer or other cancers. He denies any chronic GI symptoms.  He has regular bowel movements, with formed browns stools, typically twice a day.  No blood. No chronic upper GI symptoms.   No past medical history on file.  Past Surgical History:  Procedure Laterality Date   APPENDECTOMY     KNEE ARTHROSCOPY WITH MENISCAL REPAIR       Family History  Problem Relation Age of Onset   Colon cancer Father    Diabetes Maternal Grandmother    Liver cancer Neg Hx    Esophageal cancer Neg Hx    Social History   Tobacco Use   Smoking status: Never   Smokeless tobacco: Current    Types: Snuff  Vaping Use   Vaping Use: Never used  Substance Use Topics   Alcohol use: Yes    Alcohol/week: 10.0 standard drinks of alcohol    Types: 10 Standard drinks or equivalent per week   Drug use: Yes    Types: Marijuana   Current Outpatient Medications  Medication Sig Dispense Refill   Na Sulfate-K Sulfate-Mg Sulf 17.5-3.13-1.6 GM/177ML SOLN Take 1 kit by mouth once for 1 dose. 354 mL 0   diclofenac (VOLTAREN) 75 MG EC tablet Take 1 tablet (75 mg total) by mouth 2 (two) times daily. (Patient not taking: Reported on 10/29/2022) 30 tablet 0   No current facility-administered medications for this visit.   No Known Allergies   Review of Systems: All systems reviewed and negative except where noted in HPI.    No results found.  Physical Exam: BP 122/88   Pulse 80   Ht 5\' 7"   (1.702 m)   Wt 195 lb (88.5 kg)   SpO2 98%   BMI 30.54 kg/m  Constitutional: Pleasant,well-developed, Caucasian male in no acute distress. HEENT: Normocephalic and atraumatic. Conjunctivae are normal. No scleral icterus. Neck supple.  Cardiovascular: Normal rate, regular rhythm.  Pulmonary/chest: Effort normal and breath sounds normal. No wheezing, rales or rhonchi. Abdominal: Soft, nondistended, nontender. Bowel sounds active throughout. There are no masses palpable. No hepatomegaly. Extremities: no edema Neurological: Alert and oriented to person place and time. Skin: Skin is warm and dry. No rashes noted. Psychiatric: Normal mood and affect. Behavior is normal.  CBC    Component Value Date/Time   WBC 8.8 04/28/2021 1516   RBC 5.57 04/28/2021 1516   HGB 16.3 04/28/2021 1516   HCT 48.6 04/28/2021 1516   PLT 219 04/28/2021 1516   MCV 87 04/28/2021 1516   MCH 29.3 04/28/2021 1516   MCHC 33.5 04/28/2021 1516   RDW 13.1 04/28/2021 1516   LYMPHSABS 3.3 (H) 04/28/2021 1516   EOSABS 0.2 04/28/2021 1516   BASOSABS 0.1 04/28/2021 1516    CMP     Component Value Date/Time   NA 139 04/28/2021 1516   K 4.5 04/28/2021 1516   CL 101 04/28/2021 1516   CO2 24 04/28/2021  1516   GLUCOSE 78 04/28/2021 1516   BUN 15 04/28/2021 1516   CREATININE 0.93 04/28/2021 1516   CALCIUM 9.6 04/28/2021 1516   PROT 6.9 04/28/2021 1516   ALBUMIN 4.9 04/28/2021 1516   AST 26 04/28/2021 1516   ALT 30 04/28/2021 1516   ALKPHOS 60 04/28/2021 1516   BILITOT 0.7 04/28/2021 1516     ASSESSMENT AND PLAN: 37 year old male with family history of colon cancer (father, 75)  Fam hx CRC - Colonoscopy - q5 yrs - Get genetic info re: father  Chevis Pretty, *

## 2022-10-29 NOTE — Patient Instructions (Addendum)
_______________________________________________________  If your blood pressure at your visit was 140/90 or greater, please contact your primary care physician to follow up on this.  _______________________________________________________  If you are age 39 or older, your body mass index should be between 23-30. Your Body mass index is 30.54 kg/m. If this is out of the aforementioned range listed, please consider follow up with your Primary Care Provider.  If you are age 42 or younger, your body mass index should be between 19-25. Your Body mass index is 30.54 kg/m. If this is out of the aformentioned range listed, please consider follow up with your Primary Care Provider.   You have been scheduled for a colonoscopy. Please follow written instructions given to you at your visit today.  Please pick up your prep supplies at the pharmacy within the next 1-3 days. If you use inhalers (even only as needed), please bring them with you on the day of your procedure.    ________________________________________________________  The [facility] providers would like to encourage you to use [facility] to communicate with providers for non-urgent requests or questions.  Due to long hold times on the telephone, sending your provider a message by [facility] may be a faster and more efficient way to get a response.  Please allow 48 business hours for a response.  Please remember that this is for non-urgent requests.   It was a pleasure to see you today!  Thank you for trusting me with your gastrointestinal care!    Scott E.Candis Schatz, MD

## 2022-12-14 ENCOUNTER — Encounter: Payer: Self-pay | Admitting: Gastroenterology

## 2022-12-21 ENCOUNTER — Telehealth: Payer: Self-pay | Admitting: *Deleted

## 2022-12-21 DIAGNOSIS — Z800 Family history of malignant neoplasm of digestive organs: Secondary | ICD-10-CM

## 2022-12-21 MED ORDER — NA SULFATE-K SULFATE-MG SULF 17.5-3.13-1.6 GM/177ML PO SOLN: 1.0000 | Freq: Once | ORAL | 0 refills | Status: AC

## 2022-12-21 NOTE — Telephone Encounter (Signed)
Spoke with pt and told him not to chew snuff after midnight the day of procedure.  Also, Suprep resent to pharmacy per pt request

## 2022-12-24 ENCOUNTER — Encounter: Payer: BC Managed Care – PPO | Admitting: Gastroenterology

## 2022-12-24 ENCOUNTER — Encounter: Payer: Self-pay | Admitting: Gastroenterology

## 2022-12-24 ENCOUNTER — Ambulatory Visit (AMBULATORY_SURGERY_CENTER): Payer: BC Managed Care – PPO | Admitting: Gastroenterology

## 2022-12-24 VITALS — BP 121/70 | HR 65 | Temp 98.1°F | Resp 12 | Ht 67.0 in | Wt 195.0 lb

## 2022-12-24 DIAGNOSIS — Z800 Family history of malignant neoplasm of digestive organs: Secondary | ICD-10-CM | POA: Diagnosis not present

## 2022-12-24 DIAGNOSIS — Z1211 Encounter for screening for malignant neoplasm of colon: Secondary | ICD-10-CM

## 2022-12-24 MED ORDER — SODIUM CHLORIDE 0.9 % IV SOLN: 500.0000 mL | Freq: Once | INTRAVENOUS | Status: DC

## 2022-12-24 NOTE — Patient Instructions (Addendum)
-  repeat colonoscopy in 5 years for surveillance recommended. -Continue present medications  - Refer to a genetics counselor at appointment to be scheduled given history of genetic mutation in father.  YOU HAD AN ENDOSCOPIC PROCEDURE TODAY AT THE [facility]:   Refer to the procedure report that was given to you for any specific questions about what was found during the examination.  If the procedure report does not answer your questions, please call your gastroenterologist to clarify.  If you requested that your care partner not be given the details of your procedure findings, then the procedure report has been included in a sealed envelope for you to review at your convenience later.  YOU SHOULD EXPECT: Some feelings of bloating in the abdomen. Passage of more gas than usual.  Walking can help get rid of the air that was put into your GI tract during the procedure and reduce the bloating. If you had a lower endoscopy (such as a colonoscopy or flexible sigmoidoscopy) you may notice spotting of blood in your stool or on the toilet paper. If you underwent a bowel prep for your procedure, you may not have a normal bowel movement for a few days.  Please Note:  You might notice some irritation and congestion in your nose or some drainage.  This is from the oxygen used during your procedure.  There is no need for concern and it should clear up in a day or so.  SYMPTOMS TO REPORT IMMEDIATELY:  Following lower endoscopy (colonoscopy or flexible sigmoidoscopy):  Excessive amounts of blood in the stool  Significant tenderness or worsening of abdominal pains  Swelling of the abdomen that is new, acute   For urgent or emergent issues, a gastroenterologist can be reached at any hour by calling [phone]. Do not use MyChart messaging for urgent concerns.    DIET:  We do recommend a small meal at first, but then you may proceed to your regular diet.  Drink plenty of fluids but you should  avoid alcoholic beverages for 24 hours.  ACTIVITY:  You should plan to take it easy for the rest of today and you should NOT DRIVE or use heavy machinery until tomorrow (because of the sedation medicines used during the test).    FOLLOW UP: Our staff will call the number listed on your records the next business day following your procedure.  We will call around 7:15- 8:00 am to check on you and address any questions or concerns that you may have regarding the information given to you following your procedure. If we do not reach you, we will leave a message.     If any biopsies were taken you will be contacted by phone or by letter within the next 1-3 weeks.  Please call us at [phone] if you have not heard about the biopsies in 3 weeks.    SIGNATURES/CONFIDENTIALITY: You and/or your care partner have signed paperwork which will be entered into your electronic medical record.  These signatures attest to the fact that that the information above on your After Visit Summary has been reviewed and is understood.  Full responsibility of the confidentiality of this discharge information lies with you and/or your care-partner.

## 2022-12-24 NOTE — Op Note (Signed)
[facility] Patient Name: Robert Valentine Procedure Date: 12/24/2022 9:48 AM MRN: [id number] Endoscopist: Lorin Picket E. Tomasa Rand , MD, [id number] Age: 37 Referring MD:  Date of Birth: 1985/11/04 Gender: Male Account #: [id number] Procedure:                Colonoscopy Indications:              Screening in patient at increased risk: Colorectal                            cancer in father before age 72 Medicines:                Monitored Anesthesia Care Procedure:                Pre-Anesthesia Assessment:                           - Prior to the procedure, a History and Physical                            was performed, and patient medications and                            allergies were reviewed. The patient's tolerance of                            previous anesthesia was also reviewed. The risks                            and benefits of the procedure and the sedation                            options and risks were discussed with the patient.                            All questions were answered, and informed consent                            was obtained. Prior Anticoagulants: The patient has                            taken no anticoagulant or antiplatelet agents. ASA                            Grade Assessment: II - A patient with mild systemic                            disease. After reviewing the risks and benefits,                            the patient was deemed in satisfactory condition to                            undergo the procedure.  After obtaining informed consent, the colonoscope                            was passed under direct vision. Throughout the                            procedure, the patient's blood pressure, pulse, and                            oxygen saturations were monitored continuously. The                            CF [id number] #[id number] was introduced through the anus                            and advanced to the  the terminal ileum, with                            identification of the appendiceal orifice and IC                            valve. The colonoscopy was performed without                            difficulty. The patient tolerated the procedure                            well. The quality of the bowel preparation was                            good. The terminal ileum, ileocecal valve,                            appendiceal orifice, and rectum were photographed.                            The bowel preparation used was SUPREP via split                            dose instruction. Scope In: 10:01:11 AM Scope Out: 10:10:59 AM Scope Withdrawal Time: 0 hours 6 minutes 46 seconds  Total Procedure Duration: 0 hours 9 minutes 48 seconds  Findings:                 The perianal and digital rectal examinations were                            normal. Pertinent negatives include normal                            sphincter tone and no palpable rectal lesions.                           The colon (entire examined portion) appeared normal.  A diffuse area of mucosa in the terminal ileum was                            mildly erythematous.                           The retroflexed view of the distal rectum and anal                            verge was normal and showed no anal or rectal                            abnormalities. Complications:            No immediate complications. Estimated Blood Loss:     Estimated blood loss: none. Impression:               - The entire examined colon is normal.                           - Erythematous mucosa in the terminal ileum, likely                            NSAID induced. No ulcers/aptha present.                           - The distal rectum and anal verge are normal on                            retroflexion view.                           - No specimens collected.                           - The GI Genius (intelligent endoscopy  module),                            computer-aided polyp detection system powered by AI                            was utilized to detect colorectal polyps through                            enhanced visualization during colonoscopy. Recommendation:           - Patient has a contact number available for                            emergencies. The signs and symptoms of potential                            delayed complications were discussed with the                            patient. Return to normal activities tomorrow.  Written discharge instructions were provided to the                            patient.                           - Resume previous diet.                           - Continue present medications.                           - Repeat colonoscopy in 5 years for screening                            purposes.                           - Refer to a genetics counselor at appointment to                            be scheduled given history of genetic mutation in                            father. Hodge Stachnik E. Tomasa Rand, MD 12/24/2022 10:17:13 AM This report has been signed electronically.

## 2022-12-24 NOTE — Progress Notes (Signed)
VS completed by CW   Pt's states no medical or surgical changes since previsit or office visit.  

## 2022-12-24 NOTE — Progress Notes (Signed)
A and O x3. Report to RN. Tolerated MAC anesthesia well. 

## 2022-12-24 NOTE — Progress Notes (Signed)
[facility] History and Physical   Primary Care Physician:  Sonny Masters, FNP   Reason for Procedure:   Colon cancer screening, high risk/family history of colon cancer  Plan:    Screening colonoscopy     HPI: Robert Valentine is a 37 y.o. male undergoing initial screening colonoscopy.  His father was diagnosed with colon cancer in his late 19s, and the patient believes a genetic mutation was identified, but has been unable to locate the records.  He has no chronic GI symptoms.    History reviewed. No pertinent past medical history.  Past Surgical History:  Procedure Laterality Date   APPENDECTOMY     KNEE ARTHROSCOPY WITH MENISCAL REPAIR      Prior to Admission medications   Medication Sig Start Date End Date Taking? Authorizing Provider  diclofenac (VOLTAREN) 75 MG EC tablet Take 1 tablet (75 mg total) by mouth 2 (two) times daily. Patient not taking: Reported on 10/29/2022 10/08/22   Junie Spencer, FNP    Current Outpatient Medications  Medication Sig Dispense Refill   diclofenac (VOLTAREN) 75 MG EC tablet Take 1 tablet (75 mg total) by mouth 2 (two) times daily. (Patient not taking: Reported on 10/29/2022) 30 tablet 0   Current Facility-Administered Medications  Medication Dose Route Frequency Provider Last Rate Last Admin   0.9 %  sodium chloride infusion  500 mL Intravenous Once Jenel Lucks, MD        Allergies as of 12/24/2022   (No Known Allergies)    Family History  Problem Relation Age of Onset   Colon cancer Father    Diabetes Maternal Grandmother    Liver cancer Neg Hx    Esophageal cancer Neg Hx     Social History   Socioeconomic History   Marital status: Divorced    Spouse name: Not on file   Number of children: 3   Years of education: Not on file   Highest education level: Not on file  Occupational History   Occupation: Holiday representative  Tobacco Use   Smoking status: Never   Smokeless tobacco: Current    Types: Snuff   Vaping Use   Vaping Use: Never used  Substance and Sexual Activity   Alcohol use: Yes    Alcohol/week: 10.0 standard drinks of alcohol    Types: 10 Standard drinks or equivalent per week   Drug use: Yes    Types: Marijuana    Comment: yesterday per patient   Sexual activity: Not on file  Other Topics Concern   Not on file  Social History Narrative   Not on file   Social Determinants of Health   Financial Resource Strain: Not on file  Food Insecurity: Not on file  Transportation Needs: Not on file  Physical Activity: Not on file  Stress: Not on file  Social Connections: Not on file  Intimate Partner Violence: Not on file    Review of Systems:  All other review of systems negative except as mentioned in the HPI.  Physical Exam: Vital signs BP 129/60   Pulse (!) 50   Temp 98.1 F (36.7 C) (Temporal)   Ht 5\' 7"  (1.702 m)   Wt 195 lb (88.5 kg)   SpO2 98%   BMI 30.54 kg/m   General:   Alert,  Well-developed, well-nourished, pleasant and cooperative in NAD Airway:  Mallampati 2 Lungs:  Clear throughout to auscultation.   Heart:  Regular rate and rhythm; no murmurs, clicks, rubs,  or gallops.  Abdomen:  Soft, nontender and nondistended. Normal bowel sounds.   Neuro/Psych:  Normal mood and affect. A and O x 3   Ebenezer Mccaskey E. Candis Schatz, MD [facility]

## 2022-12-27 ENCOUNTER — Telehealth: Payer: Self-pay | Admitting: *Deleted

## 2022-12-27 NOTE — Telephone Encounter (Signed)
  Follow up Call-     12/24/2022    8:22 AM  Call back number  Post procedure Call Back phone  # [phone]  Permission to leave phone message Yes   [facility]

## 2022-12-28 ENCOUNTER — Other Ambulatory Visit: Payer: Self-pay

## 2022-12-28 DIAGNOSIS — Z800 Family history of malignant neoplasm of digestive organs: Secondary | ICD-10-CM

## 2023-01-11 ENCOUNTER — Ambulatory Visit (INDEPENDENT_AMBULATORY_CARE_PROVIDER_SITE_OTHER): Payer: BC Managed Care – PPO | Admitting: Family Medicine

## 2023-01-11 ENCOUNTER — Encounter: Payer: Self-pay | Admitting: Family Medicine

## 2023-01-11 VITALS — BP 115/74 | HR 65 | Temp 98.4°F | Ht 67.0 in | Wt 195.8 lb

## 2023-01-11 DIAGNOSIS — F909 Attention-deficit hyperactivity disorder, unspecified type: Secondary | ICD-10-CM | POA: Diagnosis not present

## 2023-01-11 DIAGNOSIS — R41840 Attention and concentration deficit: Secondary | ICD-10-CM

## 2023-01-11 DIAGNOSIS — H6122 Impacted cerumen, left ear: Secondary | ICD-10-CM

## 2023-01-11 NOTE — Progress Notes (Addendum)
Subjective:  Patient ID: Robert Valentine, male    DOB: 07/18/1986, 37 y.o.   MRN: [id number]  Patient Care Team: Sonny Masters, FNP as PCP - General (Family Medicine)   Chief Complaint:  ADHD (Has been off medication since patient was 37 years old.  Would like to be but on something to help him focus at work. )   HPI: Robert Valentine is a 37 y.o. male presenting on 01/11/2023 for ADHD (Has been off medication since patient was 37 years old.  Would like to be but on something to help him focus at work. )  Decreased hearing in left ear This has been ongoing for several weeks. No pain or injury reported. States ear does feel full at times.   2. Lack of concentration 3. Hyperactive Pt states he has increased hyperactivity and lack of focus. He was on ADHD medications as a child but has not been on anything in over 15 years. Feels symptoms are worsening and his is having issues focusing at work.     01/11/2023   11:28 AM 10/08/2022   11:46 AM 09/17/2022   11:58 AM 10/27/2021   12:05 PM  GAD 7 : Generalized Anxiety Score  Nervous, Anxious, on Edge 0 0 0 0  Control/stop worrying 0 0 0 0  Worry too much - different things 1 0 0 0  Trouble relaxing 0 1 1 0  Restless 0 0 0 0  Easily annoyed or irritable 1 0 0 0  Afraid - awful might happen 0 0 0 0  Total GAD 7 Score 2 1 1  0  Anxiety Difficulty Not difficult at all Somewhat difficult Somewhat difficult        01/11/2023   11:27 AM 09/17/2022   11:58 AM 10/27/2021   12:05 PM 04/28/2021    2:44 PM 11/19/2015    4:28 PM  Depression screen PHQ 2/9  Decreased Interest 0 0 0 0 0  Down, Depressed, Hopeless 0 0 0 0 0  PHQ - 2 Score 0 0 0 0 0  Altered sleeping 1 0 0 0   Tired, decreased energy 1 1 0 0   Change in appetite 1 0 0 0   Feeling bad or failure about yourself  0 0 0 1   Trouble concentrating 0 0 0 0   Moving slowly or fidgety/restless 0 0 0 0   Suicidal thoughts 0 0 0 0   PHQ-9 Score 3 1 0 1   Difficult doing work/chores  Somewhat difficult Somewhat difficult           Relevant past medical, surgical, family, and social history reviewed and updated as indicated.  Allergies and medications reviewed and updated. Data reviewed: Chart in Epic.   History reviewed. No pertinent past medical history.  Past Surgical History:  Procedure Laterality Date   APPENDECTOMY     KNEE ARTHROSCOPY WITH MENISCAL REPAIR      Social History   Socioeconomic History   Marital status: Divorced    Spouse name: Not on file   Number of children: 3   Years of education: Not on file   Highest education level: Not on file  Occupational History   Occupation: Holiday representative  Tobacco Use   Smoking status: Never   Smokeless tobacco: Current    Types: Snuff  Vaping Use   Vaping Use: Never used  Substance and Sexual Activity   Alcohol use: Yes    Alcohol/week: 10.0 standard drinks  of alcohol    Types: 10 Standard drinks or equivalent per week   Drug use: Yes    Types: Marijuana    Comment: yesterday per patient   Sexual activity: Not on file  Other Topics Concern   Not on file  Social History Narrative   Not on file   Social Determinants of Health   Financial Resource Strain: Not on file  Food Insecurity: Not on file  Transportation Needs: Not on file  Physical Activity: Not on file  Stress: Not on file  Social Connections: Not on file  Intimate Partner Violence: Not on file    Outpatient Encounter Medications as of 01/11/2023  Medication Sig   [DISCONTINUED] diclofenac (VOLTAREN) 75 MG EC tablet Take 1 tablet (75 mg total) by mouth 2 (two) times daily. (Patient not taking: Reported on 10/29/2022)   No facility-administered encounter medications on file as of 01/11/2023.    No Known Allergies  Review of Systems  Constitutional:  Positive for fatigue. Negative for activity change, appetite change, chills, diaphoresis, fever and unexpected weight change.  HENT: Negative.    Eyes: Negative.  Negative for  photophobia and visual disturbance.  Respiratory:  Negative for cough, chest tightness and shortness of breath.   Cardiovascular:  Negative for chest pain, palpitations and leg swelling.  Gastrointestinal:  Negative for abdominal pain, blood in stool, constipation, diarrhea, nausea and vomiting.  Endocrine: Negative.   Genitourinary:  Negative for decreased urine volume, difficulty urinating, dysuria, frequency and urgency.  Musculoskeletal:  Negative for arthralgias and myalgias.  Skin: Negative.   Allergic/Immunologic: Negative.   Neurological:  Negative for dizziness, tremors, seizures, syncope, facial asymmetry, speech difficulty, weakness, light-headedness, numbness and headaches.  Hematological: Negative.   Psychiatric/Behavioral:  Positive for agitation, decreased concentration and sleep disturbance. Negative for behavioral problems, confusion, dysphoric mood, hallucinations, self-injury and suicidal ideas. The patient is nervous/anxious and is hyperactive.   All other systems reviewed and are negative.       Objective:  BP 115/74   Pulse 65   Temp 98.4 F (36.9 C) (Temporal)   Ht 5\' 7"  (1.702 m)   Wt 195 lb 12.8 oz (88.8 kg)   SpO2 98%   BMI 30.67 kg/m    Wt Readings from Last 3 Encounters:  01/11/23 195 lb 12.8 oz (88.8 kg)  12/24/22 195 lb (88.5 kg)  10/29/22 195 lb (88.5 kg)    Physical Exam Vitals and nursing note reviewed.  Constitutional:      General: He is not in acute distress.    Appearance: Normal appearance. He is well-developed and well-groomed. He is obese. He is not ill-appearing, toxic-appearing or diaphoretic.  HENT:     Head: Normocephalic and atraumatic.     Jaw: There is normal jaw occlusion.     Right Ear: Hearing normal.     Left Ear: Hearing normal. There is impacted cerumen.     Nose: Nose normal.     Mouth/Throat:     Lips: Pink.     Mouth: Mucous membranes are moist.     Pharynx: Oropharynx is clear. Uvula midline.  Eyes:      General: Lids are normal.     Extraocular Movements: Extraocular movements intact.     Conjunctiva/sclera: Conjunctivae normal.     Pupils: Pupils are equal, round, and reactive to light.  Neck:     Thyroid: No thyroid mass, thyromegaly or thyroid tenderness.     Vascular: No JVD.     Trachea: Trachea and  phonation normal.  Cardiovascular:     Rate and Rhythm: Normal rate and regular rhythm.     Chest Wall: PMI is not displaced.     Pulses: Normal pulses.     Heart sounds: Normal heart sounds. No murmur heard.    No friction rub. No gallop.  Pulmonary:     Effort: Pulmonary effort is normal.     Breath sounds: Normal breath sounds.  Abdominal:     General: Bowel sounds are normal. There is no abdominal bruit.     Palpations: Abdomen is soft. There is no hepatomegaly or splenomegaly.  Musculoskeletal:        General: Normal range of motion.     Cervical back: Normal range of motion and neck supple.     Right lower leg: No edema.     Left lower leg: No edema.  Skin:    General: Skin is warm and dry.     Capillary Refill: Capillary refill takes less than 2 seconds.     Coloration: Skin is not cyanotic, jaundiced or pale.     Findings: No rash.  Neurological:     General: No focal deficit present.     Mental Status: He is alert and oriented to person, place, and time.     Sensory: Sensation is intact.     Motor: Motor function is intact.     Coordination: Coordination is intact.     Gait: Gait is intact.     Deep Tendon Reflexes: Reflexes are normal and symmetric.  Psychiatric:        Attention and Perception: Attention and perception normal.        Mood and Affect: Mood and affect normal.        Speech: Speech normal.        Behavior: Behavior normal. Behavior is cooperative.        Thought Content: Thought content normal.        Cognition and Memory: Cognition and memory normal.        Judgment: Judgment normal.     Results for orders placed or performed in visit on  04/28/21  CBC with Differential/Platelet  Result Value Ref Range   WBC 8.8 3.4 - 10.8 x10E3/uL   RBC 5.57 4.14 - 5.80 x10E6/uL   Hemoglobin 16.3 13.0 - 17.7 g/dL   Hematocrit 40.9 81.1 - 51.0 %   MCV 87 79 - 97 fL   MCH 29.3 26.6 - 33.0 pg   MCHC 33.5 31.5 - 35.7 g/dL   RDW 91.4 78.2 - 95.6 %   Platelets 219 150 - 450 x10E3/uL   Neutrophils 52 Not Estab. %   Lymphs 37 Not Estab. %   Monocytes 7 Not Estab. %   Eos 3 Not Estab. %   Basos 1 Not Estab. %   Neutrophils Absolute 4.7 1.4 - 7.0 x10E3/uL   Lymphocytes Absolute 3.3 (H) 0.7 - 3.1 x10E3/uL   Monocytes Absolute 0.6 0.1 - 0.9 x10E3/uL   EOS (ABSOLUTE) 0.2 0.0 - 0.4 x10E3/uL   Basophils Absolute 0.1 0.0 - 0.2 x10E3/uL   Immature Granulocytes 0 Not Estab. %   Immature Grans (Abs) 0.0 0.0 - 0.1 x10E3/uL  CMP14+EGFR  Result Value Ref Range   Glucose 78 65 - 99 mg/dL   BUN 15 6 - 20 mg/dL   Creatinine, Ser 2.13 0.76 - 1.27 mg/dL   eGFR 086 >57 QI/ONG/2.95   BUN/Creatinine Ratio 16 9 - 20   Sodium 139 134 - 144  mmol/L   Potassium 4.5 3.5 - 5.2 mmol/L   Chloride 101 96 - 106 mmol/L   CO2 24 20 - 29 mmol/L   Calcium 9.6 8.7 - 10.2 mg/dL   Total Protein 6.9 6.0 - 8.5 g/dL   Albumin 4.9 4.0 - 5.0 g/dL   Globulin, Total 2.0 1.5 - 4.5 g/dL   Albumin/Globulin Ratio 2.5 (H) 1.2 - 2.2   Bilirubin Total 0.7 0.0 - 1.2 mg/dL   Alkaline Phosphatase 60 44 - 121 IU/L   AST 26 0 - 40 IU/L   ALT 30 0 - 44 IU/L  Lipid panel  Result Value Ref Range   Cholesterol, Total 201 (H) 100 - 199 mg/dL   Triglycerides 94 0 - 149 mg/dL   HDL 54 >75 mg/dL   VLDL Cholesterol Cal 17 5 - 40 mg/dL   LDL Chol Calc (NIH) 643 (H) 0 - 99 mg/dL   Chol/HDL Ratio 3.7 0.0 - 5.0 ratio  Thyroid Panel With TSH  Result Value Ref Range   TSH 0.649 0.450 - 4.500 uIU/mL   T4, Total 8.5 4.5 - 12.0 ug/dL   T3 Uptake Ratio 31 24 - 39 %   Free Thyroxine Index 2.6 1.2 - 4.9  PSA, total and free  Result Value Ref Range   Prostate Specific Ag, Serum 0.8 0.0 - 4.0  ng/mL   PSA, Free 0.41 N/A ng/mL   PSA, Free Pct 51.3 %       Pertinent labs & imaging results that were available during my care of the patient were reviewed by me and considered in my medical decision making.  Assessment & Plan:  Duff was seen today for adhd.  Diagnoses and all orders for this visit:  Impacted cerumen of left ear Debrox nightly for 2 weeks, return to office then for irrigation.   Lack of concentration Hyperactive Will check labs for potential underlying causes. Referral to psychiatry for evaluation for ADHD in adulthood.  -     CMP14+EGFR -     CBC with Differential/Platelet -     Thyroid Panel With TSH -     Ambulatory referral to Psychiatry     Continue all other maintenance medications.  Follow up plan: Return in about 2 weeks (around 01/25/2023), or if symptoms worsen or fail to improve, for cerumen impaction .   Continue healthy lifestyle choices, including diet (rich in fruits, vegetables, and lean proteins, and low in salt and simple carbohydrates) and exercise (at least 30 minutes of moderate physical activity daily).  Educational handout given for ADHD  The above assessment and management plan was discussed with the patient. The patient verbalized understanding of and has agreed to the management plan. Patient is aware to call the clinic if they develop any new symptoms or if symptoms persist or worsen. Patient is aware when to return to the clinic for a follow-up visit. Patient educated on when it is appropriate to go to the emergency department.   Kari Baars, FNP-C [facility] [phone]

## 2023-01-11 NOTE — Patient Instructions (Signed)
Debrox

## 2023-01-12 LAB — CMP14+EGFR
ALT: 49 IU/L — ABNORMAL HIGH (ref 0–44)
AST: 32 IU/L (ref 0–40)
Albumin/Globulin Ratio: 2 (ref 1.2–2.2)
Albumin: 4.4 g/dL (ref 4.1–5.1)
Alkaline Phosphatase: 63 IU/L (ref 44–121)
BUN/Creatinine Ratio: 14 (ref 9–20)
BUN: 14 mg/dL (ref 6–20)
Bilirubin Total: 0.4 mg/dL (ref 0.0–1.2)
CO2: 23 mmol/L (ref 20–29)
Calcium: 9.3 mg/dL (ref 8.7–10.2)
Chloride: 99 mmol/L (ref 96–106)
Creatinine, Ser: 1 mg/dL (ref 0.76–1.27)
Globulin, Total: 2.2 g/dL (ref 1.5–4.5)
Glucose: 93 mg/dL (ref 70–99)
Potassium: 4.4 mmol/L (ref 3.5–5.2)
Sodium: 139 mmol/L (ref 134–144)
Total Protein: 6.6 g/dL (ref 6.0–8.5)
eGFR: 99 mL/min/{1.73_m2} (ref >59)

## 2023-01-12 LAB — CBC WITH DIFFERENTIAL/PLATELET
Basophils Absolute: 0.1 10*3/uL (ref 0.0–0.2)
Basos: 1 %
EOS (ABSOLUTE): 0.2 10*3/uL (ref 0.0–0.4)
Eos: 3 %
Hematocrit: 45.8 % (ref 37.5–51.0)
Hemoglobin: 15.3 g/dL (ref 13.0–17.7)
Immature Grans (Abs): 0 10*3/uL (ref 0.0–0.1)
Immature Granulocytes: 0 %
Lymphocytes Absolute: 2.4 10*3/uL (ref 0.7–3.1)
Lymphs: 29 %
MCH: 29.1 pg (ref 26.6–33.0)
MCHC: 33.4 g/dL (ref 31.5–35.7)
MCV: 87 fL (ref 79–97)
Monocytes Absolute: 0.5 10*3/uL (ref 0.1–0.9)
Monocytes: 6 %
Neutrophils Absolute: 5 10*3/uL (ref 1.4–7.0)
Neutrophils: 61 %
Platelets: 205 10*3/uL (ref 150–450)
RBC: 5.25 x10E6/uL (ref 4.14–5.80)
RDW: 13.1 % (ref 11.6–15.4)
WBC: 8.2 10*3/uL (ref 3.4–10.8)

## 2023-01-12 LAB — THYROID PANEL WITH TSH
Free Thyroxine Index: 1.6 (ref 1.2–4.9)
T3 Uptake Ratio: 26 % (ref 24–39)
T4, Total: 6.3 ug/dL (ref 4.5–12.0)
TSH: 0.732 u[IU]/mL (ref 0.450–4.500)

## 2023-01-25 ENCOUNTER — Ambulatory Visit: Payer: BC Managed Care – PPO | Admitting: Family Medicine

## 2023-01-26 ENCOUNTER — Encounter: Payer: Self-pay | Admitting: Family Medicine

## 2023-02-11 ENCOUNTER — Telehealth: Payer: Self-pay | Admitting: Genetic Counselor

## 2023-02-11 NOTE — Telephone Encounter (Signed)
Called to obtain FHx for upcoming genetics appointment.  Patient wished to reschedule genetics appt.  GC Cari notified.  Scheduler Damaris notified.

## 2023-02-14 ENCOUNTER — Encounter: Payer: BC Managed Care – PPO | Admitting: Genetic Counselor

## 2023-02-14 ENCOUNTER — Other Ambulatory Visit: Payer: BC Managed Care – PPO

## 2023-03-15 ENCOUNTER — Other Ambulatory Visit: Payer: Self-pay

## 2023-03-15 ENCOUNTER — Inpatient Hospital Stay: Payer: BC Managed Care – PPO | Attending: Genetic Counselor | Admitting: Genetic Counselor

## 2023-03-15 ENCOUNTER — Inpatient Hospital Stay: Payer: BC Managed Care – PPO

## 2023-03-15 ENCOUNTER — Encounter: Payer: Self-pay | Admitting: Genetic Counselor

## 2023-03-15 DIAGNOSIS — Z803 Family history of malignant neoplasm of breast: Secondary | ICD-10-CM | POA: Diagnosis not present

## 2023-03-15 DIAGNOSIS — Z8042 Family history of malignant neoplasm of prostate: Secondary | ICD-10-CM | POA: Diagnosis not present

## 2023-03-15 DIAGNOSIS — Z800 Family history of malignant neoplasm of digestive organs: Secondary | ICD-10-CM | POA: Diagnosis not present

## 2023-03-15 LAB — GENETIC SCREENING ORDER

## 2023-03-15 NOTE — Progress Notes (Signed)
REFERRING PROVIDER: Sonny Masters, FNP [address] [place],  [place] [address]  PRIMARY PROVIDER:  Sonny Masters, FNP  PRIMARY REASON FOR VISIT:  Encounter Diagnoses  Name Primary?   Family history of colon cancer Yes   Family history of breast cancer    HISTORY OF PRESENT ILLNESS:   Mr. Mcnelis, a 37 y.o. male, was seen for a [facility] cancer genetics consultation at the request of Dr. Reginia Forts due to a family history of cancer and his father reportedly had positive genetic testing.  Mr. Guard presents to clinic today to discuss the possibility of a hereditary predisposition to cancer, to discuss genetic testing, and to further clarify his future cancer risks, as well as potential cancer risks for family members.   Mr. Heydon is a 37 y.o. male with no personal history of cancer. He had a normal colonoscopy on 12/24/2022.    Past Surgical History:  Procedure Laterality Date   APPENDECTOMY     KNEE ARTHROSCOPY WITH MENISCAL REPAIR      Social History   Socioeconomic History   Marital status: Divorced    Spouse name: Not on file   Number of children: 3   Years of education: Not on file   Highest education level: Not on file  Occupational History   Occupation: Holiday representative  Tobacco Use   Smoking status: Never   Smokeless tobacco: Current    Types: Snuff  Vaping Use   Vaping status: Never Used  Substance and Sexual Activity   Alcohol use: Yes    Alcohol/week: 10.0 standard drinks of alcohol    Types: 10 Standard drinks or equivalent per week   Drug use: Yes    Types: Marijuana    Comment: yesterday per patient   Sexual activity: Not on file  Other Topics Concern   Not on file  Social History Narrative   Not on file   Social Determinants of Health   Financial Resource Strain: Not on file  Food Insecurity: Not on file  Transportation Needs: Not on file  Physical Activity: Not on file  Stress: Not on file  Social Connections: Not on file     FAMILY HISTORY:   We obtained a detailed, 4-generation family history.  Significant diagnoses are listed below: Family History  Problem Relation Age of Onset   Breast cancer Mother 69   Colon cancer Father 31       reports positive genetic testing- unknown gene   Diabetes Maternal Grandmother    Liver cancer Neg Hx    Esophageal cancer Neg Hx        Mr. Weitkamp father was diagnosed with colon cancer at age 70, he died at age 26. His father reportedly had positive genetic testing, but he does not know the gene and is unable to obtain a copy of his result. Mr. Marrin paternal grandfather had 4 brothers, 2 had a history of lung cancer, 1 had a history of brain cancer, and 1 had a history of prostate cancer, all are deceased. His mother was diagnosed with breast cancer at age 1, she is currently 51. There is no reported Ashkenazi Jewish ancestry.   GENETIC COUNSELING ASSESSMENT: Mr. Elden is a 37 y.o. male with a family history of cancer which is somewhat suggestive of a hereditary predisposition to cancer. We, therefore, discussed and recommended the following at today's visit.   DISCUSSION: We discussed that 5 - 10% of cancer is hereditary, with most cases of hereditary colon cancer  associated with Lynch Syndrome.  There are other genes that can be associated with hereditary colon cancer syndromes.  We discussed that testing is beneficial for several reasons, including knowing about other cancer risks, identifying potential screening and risk-reduction options that may be appropriate, and to understanding if other family members could be at risk for cancer and allowing them to undergo genetic testing.  We reviewed the characteristics, features and inheritance patterns of hereditary cancer syndromes. We also discussed genetic testing, including the appropriate family members to test, the process of testing, insurance coverage and turn-around-time for results. We discussed the implications of a negative,  positive, carrier and/or variant of uncertain significant result.   Mr. Juenemann was offered a common hereditary cancer panel (34 genes) and an expanded pan-cancer panel (71 genes). Mr. Marcus was informed of the benefits and limitations of each panel, including that expanded pan-cancer panels contain several genes that do not have clear management guidelines at this point in time.  We also discussed that as the number of genes included on a panel increases, the chances of variants of uncertain significance increases.  After considering the benefits and limitations of each gene panel, Mr. Shamah elected to have [facility] CancerNext-Expanded Panel.  The CancerNext-Expanded gene panel offered by [facility] and includes sequencing, rearrangement, and RNA analysis for the following 71 genes: AIP, ALK, APC, ATM, AXIN2, BAP1, BARD1, BMPR1A, BRCA1, BRCA2, BRIP1, CDC73, CDH1, CDK4, CDKN1B, CDKN2A, CHEK2, CTNNA1, DICER1, FH, FLCN, KIF1B, LZTR1, MAX, MEN1, MET, MLH1, MSH2, MSH3, MSH6, MUTYH, NF1, NF2, NTHL1, PALB2, PHOX2B, PMS2, POT1, PRKAR1A, PTCH1, PTEN, RAD51C, RAD51D, RB1, RET, SDHA, SDHAF2, SDHB, SDHC, SDHD, SMAD4, SMARCA4, SMARCB1, SMARCE1, STK11, SUFU, TMEM127, TP53, TSC1, TSC2, and VHL (sequencing and deletion/duplication); EGFR, EGLN1, HOXB13, KIT, MITF, PDGFRA, POLD1, and POLE (sequencing only); EPCAM and GREM1 (deletion/duplication only).    Based on Mr. Wenzinger family history of cancer, he meets medical criteria for genetic testing. Despite that he meets criteria, he may still have an out of pocket cost. We discussed that if his out of pocket cost for testing is over $100, the laboratory will call and confirm whether he wants to proceed with testing.  If the out of pocket cost of testing is less than $100 he will be billed by the genetic testing laboratory.   We discussed that some people do not want to undergo genetic testing due to fear of genetic discrimination.  A federal law called the Genetic  Information Non-Discrimination Act (GINA) of 2008 helps protect individuals against genetic discrimination based on their genetic test results.  It impacts both health insurance and employment.  With health insurance, it protects against increased premiums, being kicked off insurance or being forced to take a test in order to be insured.  For employment it protects against hiring, firing and promoting decisions based on genetic test results.  GINA does not apply to those in the [employer], those who work for companies with less than 15 employees, and new life insurance or long-term disability insurance policies.  Health status due to a cancer diagnosis is not protected under GINA.  PLAN: After considering the risks, benefits, and limitations, Mr. Shina provided informed consent to pursue genetic testing and the blood sample was sent to [facility] for analysis of the CancerNext-Expanded Panel. Results should be available within approximately 2-3 weeks' time, at which point they will be disclosed by telephone to Mr. [place], as will any additional recommendations warranted by these results. Mr. Goldson will receive a summary of his  genetic counseling visit and a copy of his results once available. This information will also be available in Epic.   Mr. Gerstman questions were answered to his satisfaction today. Our contact information was provided should additional questions or concerns arise. Thank you for the referral and allowing [place] to share in the care of your patient.   Lalla Brothers, MS, [facility] Genetic Counselor [email or web address] (P) [phone]  The patient was seen for a total of 35 minutes in face-to-face genetic counseling. The patient was seen alone.  Drs. Pamelia Hoit and/or Mosetta Putt were available to discuss this case as needed.  _______________________________________________________________________ For Office Staff:  Number of people involved in session: 1 Was an Intern/ student  involved with case: no

## 2023-03-28 ENCOUNTER — Encounter: Payer: Self-pay | Admitting: Genetic Counselor

## 2023-03-28 ENCOUNTER — Telehealth: Payer: Self-pay | Admitting: Genetic Counselor

## 2023-03-28 DIAGNOSIS — Z1379 Encounter for other screening for genetic and chromosomal anomalies: Secondary | ICD-10-CM | POA: Insufficient documentation

## 2023-03-28 NOTE — Telephone Encounter (Signed)
I contacted Mr. Yarger to discuss his genetic testing results. No pathogenic variants were identified in the 71 genes analyzed. Detailed clinic note to follow.  The test report has been scanned into EPIC and is located under the Molecular Pathology section of the Results Review tab.  A portion of the result report is included below for reference.   Lalla Brothers, MS, [facility] Genetic Counselor [email or web address] (P) [phone]

## 2023-03-30 ENCOUNTER — Ambulatory Visit: Payer: Self-pay | Admitting: Genetic Counselor

## 2023-03-30 ENCOUNTER — Encounter: Payer: Self-pay | Admitting: Genetic Counselor

## 2023-03-30 DIAGNOSIS — Z1379 Encounter for other screening for genetic and chromosomal anomalies: Secondary | ICD-10-CM

## 2023-03-30 NOTE — Progress Notes (Signed)
HPI:   Robert Valentine was previously seen in the [facility] due to a family history of cancer and concerns regarding a hereditary predisposition to cancer. Please refer to our prior cancer genetics clinic note for more information regarding our discussion, assessment and recommendations, at the time. Robert Valentine recent genetic test results were disclosed to him, as were recommendations warranted by these results. These results and recommendations are discussed in more detail below.  CANCER HISTORY:  Oncology History   No history exists.    FAMILY HISTORY:  We obtained a detailed, 4-generation family history.  Significant diagnoses are listed below:      Family History  Problem Relation Age of Onset   Breast cancer Mother 56   Colon cancer Father 62        reports positive genetic testing- unknown gene   Diabetes Maternal Grandmother     Liver cancer Neg Hx     Esophageal cancer Neg Hx                 Robert Valentine father was diagnosed with colon cancer at age 16, he died at age 31. His father reportedly had positive genetic testing, but he does not know the gene and is unable to obtain a copy of his result. Robert Valentine paternal grandfather had 4 Valentine, 2 had a history of lung cancer, 1 had a history of brain cancer, and 1 had a history of prostate cancer, all are deceased. His mother was diagnosed with breast cancer at age 62, she is currently 76. There is no reported Ashkenazi Jewish ancestry.   GENETIC TEST RESULTS:  The [facility] CancerNext-Expanded Panel found no pathogenic mutations.   The CancerNext-Expanded gene panel offered by [facility] and includes sequencing, rearrangement, and RNA analysis for the following 71 genes: AIP, ALK, APC, ATM, AXIN2, BAP1, BARD1, BMPR1A, BRCA1, BRCA2, BRIP1, CDC73, CDH1, CDK4, CDKN1B, CDKN2A, CHEK2, CTNNA1, DICER1, FH, FLCN, KIF1B, LZTR1, MAX, MEN1, MET, MLH1, MSH2, MSH3, MSH6, MUTYH, NF1, NF2, NTHL1, PALB2, PHOX2B, PMS2,  POT1, PRKAR1A, PTCH1, PTEN, RAD51C, RAD51D, RB1, RET, SDHA, SDHAF2, SDHB, SDHC, SDHD, SMAD4, SMARCA4, SMARCB1, SMARCE1, STK11, SUFU, TMEM127, TP53, TSC1, TSC2, and VHL (sequencing and deletion/duplication); EGFR, EGLN1, HOXB13, KIT, MITF, PDGFRA, POLD1, and POLE (sequencing only); EPCAM and GREM1 (deletion/duplication only).   The test report has been scanned into EPIC and is located under the Molecular Pathology section of the Results Review tab.  A portion of the result report is included below for reference. Genetic testing reported out on 03/23/2023.       Even though a pathogenic variant was not identified, possible explanations for the cancer in the family may include: There may be no hereditary risk for cancer in the family. The cancers in his family may be due to other genetic or environmental factors. There may be a gene mutation in one of these genes that current testing methods cannot detect, but that chance is small. There could be another gene that has not yet been discovered, or that we have not yet tested, that is responsible for the cancer diagnoses in the family.  It is also possible there is a hereditary cause for the cancer in the family that Robert Valentine did not inherit. This is what Robert Valentine reported, but we do not have his father's results to confirm.   Therefore, it is important to remain in touch with cancer genetics in the future so that we can continue to offer Robert Valentine the most up  to date genetic testing.   ADDITIONAL GENETIC TESTING:  We discussed with Robert Valentine that his genetic testing was fairly extensive.  If there are genes identified to increase cancer risk that can be analyzed in the future, we would be happy to discuss and coordinate this testing at that time.    CANCER SCREENING RECOMMENDATIONS:  Robert Valentine test result is considered negative (normal). An individual's cancer risk and medical management are not determined by genetic test results alone. Overall  cancer risk assessment incorporates additional factors, including personal medical history, family history, and any available genetic information that may result in a personalized plan for cancer prevention and surveillance. Therefore, it is recommended he continue to follow the cancer management and screening guidelines provided by his healthcare providers.  Based on the reported personal and family history, specific cancer screenings for Robert Valentine and his family include:  Colon Cancer Screening: If Robert Valentine father had positive genetic testing in a gene associated with colon cancer, he would be at general population risk for colon cancer and recommended to have colonoscopies every 10 years. Unfortunately, Robert Valentine is unable to obtain a copy of his father's genetic test results. Therefore, it is reasonable to repeat colonoscopies every 5 years. More frequent colonoscopies may be recommended if polyps are identified.  RECOMMENDATIONS FOR FAMILY MEMBERS:   Since he did not inherit a mutation in a cancer predisposition gene included on this panel, his children could not have inherited a mutation from him in one of these genes. Other members of the family may still carry a pathogenic variant in one of these genes that Robert Valentine did not inherit. Based on the family history, we recommend his paternal aunt have genetic counseling and testing.   FOLLOW-UP:  Cancer genetics is a rapidly advancing field and it is possible that new genetic tests will be appropriate for him and/or his family members in the future. We encouraged him to remain in contact with cancer genetics on an annual basis so we can update his personal and family histories and let him know of advances in cancer genetics that may benefit this family.   Our contact number was provided. Robert Valentine questions were answered to his satisfaction, and he knows he is welcome to call us at anytime with additional questions or concerns.    Robert Brothers, MS, [facility] Genetic Counselor [email or web address] (P) [phone]

## 2023-05-20 DIAGNOSIS — H10013 Acute follicular conjunctivitis, bilateral: Secondary | ICD-10-CM | POA: Diagnosis not present

## 2024-04-02 DIAGNOSIS — M79632 Pain in left forearm: Secondary | ICD-10-CM | POA: Diagnosis not present

## 2024-04-02 DIAGNOSIS — S5782XA Crushing injury of left forearm, initial encounter: Secondary | ICD-10-CM | POA: Diagnosis not present

## 2024-04-02 DIAGNOSIS — F17220 Nicotine dependence, chewing tobacco, uncomplicated: Secondary | ICD-10-CM | POA: Diagnosis not present

## 2024-04-02 DIAGNOSIS — M79622 Pain in left upper arm: Secondary | ICD-10-CM | POA: Diagnosis not present

## 2024-04-02 DIAGNOSIS — S50312A Abrasion of left elbow, initial encounter: Secondary | ICD-10-CM | POA: Diagnosis not present

## 2024-04-02 DIAGNOSIS — M7989 Other specified soft tissue disorders: Secondary | ICD-10-CM | POA: Diagnosis not present

## 2024-04-02 DIAGNOSIS — Y92009 Unspecified place in unspecified non-institutional (private) residence as the place of occurrence of the external cause: Secondary | ICD-10-CM | POA: Diagnosis not present

## 2024-04-02 DIAGNOSIS — W208XXA Other cause of strike by thrown, projected or falling object, initial encounter: Secondary | ICD-10-CM | POA: Diagnosis not present

## 2024-04-02 DIAGNOSIS — R9389 Abnormal findings on diagnostic imaging of other specified body structures: Secondary | ICD-10-CM | POA: Diagnosis not present

## 2024-04-02 DIAGNOSIS — S59912A Unspecified injury of left forearm, initial encounter: Secondary | ICD-10-CM | POA: Diagnosis not present

## 2024-04-04 DIAGNOSIS — S5012XA Contusion of left forearm, initial encounter: Secondary | ICD-10-CM | POA: Diagnosis not present

## 2024-04-17 DIAGNOSIS — R229 Localized swelling, mass and lump, unspecified: Secondary | ICD-10-CM | POA: Diagnosis not present

## 2024-04-17 DIAGNOSIS — Z1330 Encounter for screening examination for mental health and behavioral disorders, unspecified: Secondary | ICD-10-CM | POA: Diagnosis not present
